# Patient Record
Sex: Male | Born: 1995 | Race: White | Hispanic: Yes | Marital: Single | Smoking: Never smoker
Health system: Southern US, Community
[De-identification: ages and names within clinical notes are randomized; demographics above are authoritative.]

## PROBLEM LIST (undated history)

## (undated) DIAGNOSIS — F32A Depression, unspecified: Secondary | ICD-10-CM

## (undated) DIAGNOSIS — T7840XA Allergy, unspecified, initial encounter: Secondary | ICD-10-CM

## (undated) DIAGNOSIS — F329 Major depressive disorder, single episode, unspecified: Secondary | ICD-10-CM

## (undated) DIAGNOSIS — E669 Obesity, unspecified: Secondary | ICD-10-CM

## (undated) DIAGNOSIS — E119 Type 2 diabetes mellitus without complications: Secondary | ICD-10-CM

## (undated) DIAGNOSIS — J45909 Unspecified asthma, uncomplicated: Secondary | ICD-10-CM

## (undated) HISTORY — PX: CIRCUMCISION: SUR203

---
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
## 2014-02-03 ENCOUNTER — Other Ambulatory Visit (HOSPITAL_COMMUNITY): Payer: Self-pay | Admitting: Pediatric Endocrinology

## 2014-02-06 ENCOUNTER — Other Ambulatory Visit (HOSPITAL_COMMUNITY): Payer: Self-pay | Admitting: Pediatric Endocrinology

## 2014-02-16 ENCOUNTER — Other Ambulatory Visit (HOSPITAL_COMMUNITY): Payer: Self-pay | Admitting: Pediatric Endocrinology

## 2014-02-17 ENCOUNTER — Other Ambulatory Visit: Payer: Self-pay | Admitting: *Deleted

## 2014-02-17 DIAGNOSIS — E1065 Type 1 diabetes mellitus with hyperglycemia: Secondary | ICD-10-CM

## 2014-02-17 DIAGNOSIS — IMO0002 Reserved for concepts with insufficient information to code with codable children: Secondary | ICD-10-CM

## 2014-02-17 LAB — COMPREHENSIVE METABOLIC PANEL
ALT: 11 U/L (ref 0–53)
AST: 13 U/L (ref 0–37)
Albumin: 4.6 g/dL (ref 3.5–5.2)
Alkaline Phosphatase: 104 U/L (ref 39–117)
BUN: 18 mg/dL (ref 6–23)
CALCIUM: 9.5 mg/dL (ref 8.4–10.5)
CHLORIDE: 104 meq/L (ref 96–112)
CO2: 29 meq/L (ref 19–32)
Creat: 0.79 mg/dL (ref 0.50–1.35)
Glucose, Bld: 204 mg/dL (ref 70–99)
POTASSIUM: 4.2 meq/L (ref 3.5–5.3)
SODIUM: 141 meq/L (ref 135–145)
TOTAL PROTEIN: 7.1 g/dL (ref 6.0–8.3)
Total Bilirubin: 0.6 mg/dL (ref 0.2–1.1)

## 2014-02-17 LAB — HEMOGLOBIN A1C
HEMOGLOBIN A1C: 10.4 % — AB (ref <5.7)
Mean Plasma Glucose: 252 mg/dL — ABNORMAL HIGH (ref <117)

## 2014-02-17 LAB — LIPID PANEL
CHOLESTEROL: 220 mg/dL — AB (ref 0–169)
HDL: 43 mg/dL (ref >34)
LDL CALC: 167 mg/dL — AB (ref 0–109)
TRIGLYCERIDES: 51 mg/dL (ref <150)
Total CHOL/HDL Ratio: 5.1 Ratio
VLDL: 10 mg/dL (ref 0–40)

## 2014-02-18 LAB — T4, FREE: FREE T4: 1.2 ng/dL (ref 0.80–1.80)

## 2014-02-18 LAB — MICROALBUMIN / CREATININE URINE RATIO
Creatinine, Urine: 484.3 mg/dL
MICROALB/CREAT RATIO: 3.8 mg/g (ref 0.0–30.0)
Microalb, Ur: 1.82 mg/dL (ref 0.00–1.89)

## 2014-02-18 LAB — C-PEPTIDE: C PEPTIDE: 0.15 ng/mL — AB (ref 0.80–3.90)

## 2014-02-18 LAB — T3, FREE: T3 FREE: 3.7 pg/mL (ref 2.3–4.2)

## 2014-02-18 LAB — TSH: TSH: 1.741 u[IU]/mL (ref 0.350–4.500)

## 2014-02-22 ENCOUNTER — Other Ambulatory Visit: Payer: Self-pay | Admitting: *Deleted

## 2014-02-22 ENCOUNTER — Encounter: Payer: Self-pay | Admitting: "Endocrinology

## 2014-02-22 ENCOUNTER — Ambulatory Visit (INDEPENDENT_AMBULATORY_CARE_PROVIDER_SITE_OTHER): Payer: Medicaid Other | Admitting: "Endocrinology

## 2014-02-22 VITALS — BP 120/61 | HR 76 | Wt 212.0 lb

## 2014-02-22 DIAGNOSIS — E11649 Type 2 diabetes mellitus with hypoglycemia without coma: Secondary | ICD-10-CM

## 2014-02-22 DIAGNOSIS — E049 Nontoxic goiter, unspecified: Secondary | ICD-10-CM

## 2014-02-22 DIAGNOSIS — E1065 Type 1 diabetes mellitus with hyperglycemia: Secondary | ICD-10-CM

## 2014-02-22 DIAGNOSIS — E1169 Type 2 diabetes mellitus with other specified complication: Secondary | ICD-10-CM

## 2014-02-22 DIAGNOSIS — IMO0002 Reserved for concepts with insufficient information to code with codable children: Secondary | ICD-10-CM

## 2014-02-22 DIAGNOSIS — E663 Overweight: Secondary | ICD-10-CM

## 2014-02-22 LAB — GLUCOSE, POCT (MANUAL RESULT ENTRY): POC Glucose: 247 mg/dl — AB (ref 70–99)

## 2014-02-22 MED ORDER — ACCU-CHEK FASTCLIX LANCETS MISC: 1.0000 | Freq: Every day | Status: DC

## 2014-02-22 MED ORDER — INSULIN ASPART 100 UNIT/ML FLEXPEN: PEN_INJECTOR | SUBCUTANEOUS | Status: DC

## 2014-02-22 MED ORDER — GLUCOSE BLOOD VI STRP
ORAL_STRIP | Status: DC
Start: 2014-02-22 — End: 2014-07-30

## 2014-02-22 MED ORDER — INSULIN GLARGINE 100 UNIT/ML SOLOSTAR PEN: PEN_INJECTOR | SUBCUTANEOUS | Status: DC

## 2014-02-22 NOTE — Progress Notes (Signed)
Subjective:  Patient Name: Justin [name] Date of Birth: 13-Mar-1996  MRN: [id number]  Justin [name]  presents to the office today for follow-up evaluation and management of his type 1 diabetes (diagnosed Sept 2013), depression, and noncompliance  HISTORY OF PRESENT ILLNESS:   Justin [name] is a 18 y.o. Hispanic male   Justin [name] was unaccompanied.  1. Justin [name] was diagnosed to have diabetes mellitus by his PCP, Dr. Willow [name], during a routine physical exam on 08/05/12. He complained of urinary frequency, urinary urgency, and increased thirst and drinking of fluids, Dr. Drue [name] obtained a urinalysis which showed both glycosuria and ketonuria. CBG was 433. Dr. Drue [name] then referred the patient to Dr. Romero [name], adult endocrinologist at [facility]. As part of Dr. George [name] initial evaluation, he ordered several lab tests. Serum glucose was 383, CO2 was 21, HbA1c was 10.4%. TSH was 2.84. Dr. Everardo [name] diagnosed T1DM and started the patient on a multiple daily injection (MDI) of insulins, using Lantus as a basal insulin and Novolog aspart as a bolus insulin at mealtimes. Unfortunately, the patient was not compliant with checking BGs or taking insulins. In October 2013 he was admitted to [facility] in DKA with pH 6.99. At that time I assumed management of Justin [name]'s diabetes.    2. The patient's last [facility] visit was on 02/16/13. In the interim, he has been fairly healthy. He takes a Lantus dose of 40 units. He also follows his Novolog 150/50/15 plan. He checks his BGs "when I can, 2-4 times per day."  Overall he feels much happier.   3. Pertinent Review of Systems:  Constitutional: The patient feels "great". The patient seems healthy and active. Eyes: Vision seems to be good. There are no recognized eye problems. Last eye exam was in 2013. He has a follow up appointment soon. Neck: The patient has no complaints of anterior neck swelling, soreness, tenderness, pressure, discomfort, or difficulty swallowing.   Heart: Heart rate increases with  exercise or other physical activity. The patient has no complaints of palpitations, irregular heart beats, chest pain, or chest pressure.   Gastrointestinal: Bowel movents seem normal. The patient has no complaints of excessive hunger, acid reflux, upset stomach, stomach aches or pains, diarrhea, or constipation.  Legs: Muscle mass and strength seem normal. There are no complaints of numbness, tingling, burning, or pain. No edema is noted.  Feet: There are no obvious foot problems. There are no complaints of numbness, tingling, burning, or pain. No edema is noted.  Neurologic: There are no recognized problems with muscle movement and strength, sensation, or coordination.  4. BG printout: He checks BGs 2-4 times per day, mostly 3 times per day. He often misses the bedtime BG check. His average BG is 253, range 137-454. He needs more insulin across the board.   PAST MEDICAL, FAMILY, AND SOCIAL HISTORY  Past Medical History  Diagnosis Date  . Diabetes mellitus without complication   . Asthma     as a child, hospitalized at age 30  . Allergy     allergic rhinitis  . Obesity   . Depression     Family History  Problem Relation Age of Onset  . Cancer Neg Hx   . Diabetes Neg Hx   . Heart failure Neg Hx   . Hyperlipidemia Neg Hx   . Thyroid disease Neg Hx   . Stroke Neg Hx     Current outpatient prescriptions:ACCU-CHEK FASTCLIX LANCETS MISC, 1 each by Does not apply route 6 (six) times daily. Check sugar 6 x  daily, Disp: 204 each, Rfl: 3;  ACCU-CHEK SMARTVIEW test strip, CHECK SUGAR 6 X DAILY, Disp: 200 each, Rfl: 3;  insulin aspart (NOVOLOG FLEXPEN) 100 UNIT/ML injection, Inject 1-50 Units into the skin daily., Disp: 5 pen, Rfl: 3 insulin glargine (LANTUS SOLOSTAR) 100 UNIT/ML injection, Up to 50 units per day as directed by MD, Disp: 15 mL, Rfl: 3;  Insulin Pen Needle 31G X 5 MM MISC, Use with insulin pen 6 x daily, Disp: 200 each, Rfl: 6;  LANTUS SOLOSTAR 100 UNIT/ML SOPN, UP TO 50 UNITS PER  DAY AS DIRECTED BY MD, Disp: 15 pen, Rfl: 3;  NOVOLOG FLEXPEN 100 UNIT/ML SOPN FlexPen, INJECT 1-50 UNITS INTO THE SKIN DAILY., Disp: 5 pen, Rfl: 3  Allergies as of 02/22/2014  . (No Known Allergies)     reports that he has never smoked. He has never used smokeless tobacco. He reports that he does not drink alcohol or use illicit drugs. Pediatric History  Patient Guardian Status  . Father:  Justin [name]   Other Topics Concern  . 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 The patient does not yet drive. Father is the owner of a painting and roofing company. Mother is a stay-at-home mom.   Work and family: He graduated from high school last Summer. 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 He still lives at home.  Activities: He works out occasionally. Primary Care Provider: Willow Ora, MD  REVIEW OF SYSTEMS: There are no other significant problems involving Justin [name]'s other body systems.   Objective:  Vital Signs:  BP 120/61  Pulse 76  Wt 212 lb (96.163 kg)   Ht Readings from Last 3 Encounters:  02/16/13 5' 6.93" (1.7 m) (22%*, Z = -0.77)  11/13/12 5' 7.01" (1.702 m) (24%*, Z = -0.70)  11/04/12 5\' 7"  (1.702 m) (24%*, Z = -0.70)   * Growth percentiles are based on CDC 2-20 Years data.   Wt Readings from Last 3 Encounters:  02/22/14 212 lb (96.163 kg) (96%*, Z = 1.80)  02/16/13 191 lb (86.637 kg) (93%*, Z = 1.48)  11/13/12 185 lb 14.4 oz (84.324 kg) (92%*, Z = 1.41)   * Growth percentiles are based on CDC 2-20 Years data.   HC Readings from Last 3 Encounters:  No data found for [facility]   There is no height on file to calculate BSA. No height on file for this encounter. 96%ile (Z=1.80) based on CDC 2-20 Years weight-for-age data.    PHYSICAL EXAM:  Constitutional: The patient appears healthy and well nourished. The patient's height and weight are normal for age. He has  gained 27 pounds in the past year, a lot of which is muscle. He is alert and bright. Head: The head is normocephalic. Face: The face appears normal. There are no obvious dysmorphic features. Eyes: The eyes appear to be normally formed and spaced. Gaze is conjugate. There is no obvious arcus or proptosis. Moisture appears normal. Ears: The ears are normally placed and appear externally normal. Mouth: The oropharynx and tongue appear normal. Dentition appears to be normal for age. Oral moisture is normal. Neck: The neck appears to be visibly enlarged. His strap muscles are enlarged. The thyroid gland is enlarged at 23-25 grams in size. The consistency of the thyroid gland is normal. The thyroid gland is not tender to palpation.  Lungs: The lungs are clear to  auscultation. Air movement is good. Heart: Heart rate and rhythm are regular. Heart sounds S1 and S2 are normal. I did not appreciate any pathologic cardiac murmurs. Abdomen: The abdomen appears to be normal in size for the patient's age. Bowel sounds are normal. There is no obvious hepatomegaly, splenomegaly, or other mass effect.  Arms: Muscle size and bulk are normal for age. Hands: There is no obvious tremor. Phalangeal and metacarpophalangeal joints are normal. Palmar muscles are normal for age. Palmar skin is normal. Palmar moisture is also normal. Legs: Muscles appear normal for age. No edema is present. Feet: Feet are normally formed. Dorsalis pedal pulses are normal.  Neurologic: Strength is normal for age in both the upper and lower extremities. Muscle tone is normal. Sensation to touch is normal in both the legs and feet.    LAB DATA:   Results for orders placed in visit on 02/22/14 (from the past 504 hour(s))  GLUCOSE, POCT (MANUAL RESULT ENTRY)   Collection Time    02/22/14  1:12 PM      Result Value Ref Range   POC Glucose 247 (*) 70 - 99 mg/dl  Results for orders placed in visit on 02/17/14 (from the past 504 hour(s))   COMPREHENSIVE METABOLIC PANEL   Collection Time    02/17/14  3:42 PM      Result Value Ref Range   Sodium 141  135 - 145 mEq/L   Potassium 4.2  3.5 - 5.3 mEq/L   Chloride 104  96 - 112 mEq/L   CO2 29  19 - 32 mEq/L   Glucose, Bld 204 (*) 70 - 99 mg/dL   BUN 18  6 - 23 mg/dL   Creat 1.610.79  0.960.50 - 0.451.35 mg/dL   Total Bilirubin 0.6  0.2 - 1.1 mg/dL   Alkaline Phosphatase 104  39 - 117 U/L   AST 13  0 - 37 U/L   ALT 11  0 - 53 U/L   Total Protein 7.1  6.0 - 8.3 g/dL   Albumin 4.6  3.5 - 5.2 g/dL   Calcium 9.5  8.4 - 40.910.5 mg/dL  LIPID PANEL   Collection Time    02/17/14  3:42 PM      Result Value Ref Range   Cholesterol 220 (*) 0 - 169 mg/dL   Triglycerides 51  <811<150 mg/dL   HDL 43  >91>34 mg/dL   Total CHOL/HDL Ratio 5.1     VLDL 10  0 - 40 mg/dL   LDL Cholesterol 478167 (*) 0 - 109 mg/dL  MICROALBUMIN / CREATININE URINE RATIO   Collection Time    02/17/14  3:42 PM      Result Value Ref Range   Microalb, Ur 1.82  0.00 - 1.89 mg/dL   Creatinine, Urine 295.6484.3     Microalb Creat Ratio 3.8  0.0 - 30.0 mg/g  TSH   Collection Time    02/17/14  3:42 PM      Result Value Ref Range   TSH 1.741  0.350 - 4.500 uIU/mL  T4, FREE   Collection Time    02/17/14  3:42 PM      Result Value Ref Range   Free T4 1.20  0.80 - 1.80 ng/dL  T3, FREE   Collection Time    02/17/14  3:42 PM      Result Value Ref Range   T3, Free 3.7  2.3 - 4.2 pg/mL  C-PEPTIDE   Collection Time    02/17/14  3:42 PM      Result Value Ref Range   C-Peptide 0.15 (*) 0.80 - 3.90 ng/mL  HEMOGLOBIN A1C   Collection Time    02/17/14  3:42 PM      Result Value Ref Range   Hemoglobin A1C 10.4 (*) <5.7 %   Mean Plasma Glucose 252 (*) <117 mg/dL   Labs 1/61/09: UEA5W 09.8 %, compared with 7.2% in March 2014.  CMP normal, except glucose 204; cholesterol 220, triglycerides 51, HDL 43, LDL 167; urinary microalbumin/creatinine ratio 3.8; TSH 1.741, free T4 1.20, free T3 3.7; C-peptide < 0.15   Assessment and Plan:    ASSESSMENT: 1. Type 1 diabetes: His BG control is much worse. He produces very little, if any, insulin on his own.  2. Hypoglycemia: None recently   3. Overweight: By BMI he is overweight, but in fact he is very muscular.  4. Non-compliance/ Depression: He is doing much better as he matures emotionally. He is not depressed today. 5. Goiter: His thyroid gland is larger, but he remains euthyroid. He likely has evolving Hashimoto's thyroiditis.   PLAN:  1. Diagnostic: A1C today. Continue home monitoring. Work on minimum 4 checks per day. Call on a Wednesday or Sunday evening in one month to discuss BGs.  2. Therapeutic: Increase Lantus by one unit per week until most AM BGs are in the 80-120 range or you reach a max dose of up to 45 units per day. Increase Novolog by 1 unit at [name] meals.  3. Patient education: Discussed changes to behavior and health since starting to take better care of his sugar.   4. Follow-up: 3 months   Level of Service: This visit lasted in excess of 40 minutes. More than 50% of the visit was devoted to counseling.  David Stall, MD

## 2014-02-22 NOTE — Patient Instructions (Addendum)
Follow up visit in 3 months. Please increase your Lantus dose by one unit per week until most AM BGs are in the 80-120 range or you reach a maximum dose of 45 units per day. Please add one additional unit of Novolog at each meal. Call in one month on a Wednesday or Sunday evening between 8:00-9:30 PM.

## 2014-04-20 ENCOUNTER — Ambulatory Visit: Payer: Self-pay | Admitting: Pediatric Endocrinology

## 2014-05-24 ENCOUNTER — Other Ambulatory Visit: Payer: Self-pay | Admitting: Pediatric Endocrinology

## 2014-05-24 ENCOUNTER — Other Ambulatory Visit (HOSPITAL_COMMUNITY): Payer: Self-pay | Admitting: Internal Medicine

## 2014-05-25 ENCOUNTER — Other Ambulatory Visit: Payer: Self-pay | Admitting: Pediatric Endocrinology

## 2014-05-25 DIAGNOSIS — E1065 Type 1 diabetes mellitus with hyperglycemia: Secondary | ICD-10-CM

## 2014-05-25 DIAGNOSIS — IMO0002 Reserved for concepts with insufficient information to code with codable children: Secondary | ICD-10-CM

## 2014-05-26 ENCOUNTER — Encounter: Payer: Self-pay | Admitting: Pediatric Endocrinology

## 2014-05-26 ENCOUNTER — Ambulatory Visit (INDEPENDENT_AMBULATORY_CARE_PROVIDER_SITE_OTHER): Payer: Medicaid Other | Admitting: Pediatric Endocrinology

## 2014-05-26 VITALS — BP 117/71 | HR 89 | Wt 215.4 lb

## 2014-05-26 DIAGNOSIS — IMO0002 Reserved for concepts with insufficient information to code with codable children: Secondary | ICD-10-CM

## 2014-05-26 DIAGNOSIS — R739 Hyperglycemia, unspecified: Secondary | ICD-10-CM

## 2014-05-26 DIAGNOSIS — R7309 Other abnormal glucose: Secondary | ICD-10-CM

## 2014-05-26 DIAGNOSIS — E1065 Type 1 diabetes mellitus with hyperglycemia: Secondary | ICD-10-CM

## 2014-05-26 LAB — GLUCOSE, POCT (MANUAL RESULT ENTRY): POC Glucose: 248 mg/dl — AB (ref 70–99)

## 2014-05-26 LAB — POCT GLYCOSYLATED HEMOGLOBIN (HGB A1C): Hemoglobin A1C: 10.4

## 2014-05-26 NOTE — Patient Instructions (Signed)
Work on checking sugar more often and paying more attention to your carb count. Use GOMEALS app to help  Rules of 150: Total carbs for day <150 grams Total exercise for week >150 minutes Target blood sugar 150  Goal- 4 checks per day and cover your carbs.

## 2014-05-26 NOTE — Progress Notes (Signed)
Subjective:  Patient Name: Justin [name] Date of Birth: May 28, 1996  MRN: [id number]  Justin [name]  presents to the office today for follow-up evaluation and management of his type 1 diabetes (diagnosed Sept 2013), depression, and noncompliance  HISTORY OF PRESENT ILLNESS:   Justin [name] is a 18 y.o. Hispanic male   Justin [name] was unaccompanied.  1. Justin [name] was diagnosed to have diabetes mellitus by his PCP, Dr. Willow Ora, during a routine physical exam on 08/05/12. He complained of urinary frequency, urinary urgency, and increased thirst and drinking of fluids, Dr. Drue Novel obtained a urinalysis which showed both glycosuria and ketonuria. CBG was 433. Dr. Drue Novel then referred the patient to Dr. Romero Belling, adult endocrinologist at [facility]. As part of Dr. George Hugh initial evaluation, he ordered several lab tests. Serum glucose was 383, CO2 was 21, HbA1c was 10.4%. TSH was 2.84. Dr. Everardo All diagnosed T1DM and started the patient on a multiple daily injection (MDI) of insulins, using Lantus as a basal insulin and Novolog aspart as a bolus insulin at mealtimes. Unfortunately, the patient was not compliant with checking BGs or taking insulins. In October 2013 he was admitted to [facility] in DKA with pH 6.99. At that time we assumed management of Justin [name]'s diabetes.    2. The patient's last [facility] visit was on 02/22/14. In the interim, he has been fairly healthy. He had gone 1 year without being seen between 3/14 and 3/15.   He takes a Lantus dose of 45 units. He also follows his Novolog 150/50/15 +1 plan. He tends to eat a bedtime snack that he does not cover. He has been trying to break out of that habit. He usually does not count his carbs but takes 10-15 units depending on size of meal and how high he thinks his sugar. He thinks that when he works outside in the sun his sugars tend to be lower. He has not had any real lows. He admits he doesn't usually pay that much attention to his diabetes, misses bgs, and doesn't count carbs. 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 He is somewhat nervous about going to see a new doctor.   3. Pertinent Review of Systems:  Constitutional: The patient feels "great". The patient seems healthy and active. Eyes: Vision seems to be good. There are no recognized eye problems. Last eye exam was April 2015. He got new glasses.  Neck: The patient has no complaints of anterior neck swelling, soreness, tenderness, pressure, discomfort, or difficulty swallowing.   Heart: Heart rate increases with exercise or other physical activity. The patient has no complaints of palpitations, irregular heart beats, chest pain, or chest pressure.   Gastrointestinal: Bowel movents seem normal. The patient has no complaints of excessive hunger, acid reflux, upset stomach, stomach aches or pains, diarrhea, or constipation.  Legs: Muscle mass and strength seem normal. There are no complaints of numbness, tingling, burning, or pain. No edema is noted.  Feet: There are no obvious foot problems. There are no complaints of numbness, tingling, burning, or pain. No edema is noted.  Neurologic: There are no recognized problems with muscle movement and strength, sensation, or coordination.  Diabetes ID: none  4. BG printout: He is checking 3.3 times per day avg (usually 2, sometimes more). Avg BG 270 +/- 86. Range 110-583   PAST MEDICAL, FAMILY, AND SOCIAL HISTORY  Past Medical History  Diagnosis Date  . Diabetes mellitus without complication   . Asthma     as  a child, hospitalized at age 89  . Allergy     allergic rhinitis  . Obesity   . Depression     Family History  Problem Relation Age of Onset  . Cancer Neg Hx   . Diabetes Neg Hx   . Heart failure Neg Hx   . Hyperlipidemia Neg Hx   . Thyroid disease Neg Hx   . Stroke Neg Hx     Current outpatient prescriptions:ACCU-CHEK FASTCLIX LANCETS MISC, 1 each by Does not apply route 6 (six) times daily. Check sugar 6 x daily, Disp: 204  each, Rfl: 4;  B-D UF III MINI PEN NEEDLES 31G X 5 MM MISC, USE WITH INSULIN PEN 6 X DAILY, Disp: 200 each, Rfl: 6;  glucose blood (ACCU-CHEK SMARTVIEW) test strip, Check blood sugar 6x day., Disp: 200 each, Rfl: 4 insulin aspart (NOVOLOG FLEXPEN) 100 UNIT/ML FlexPen, INJECT 1-50 UNITS INTO THE SKIN DAILY., Disp: 5 pen, Rfl: 4;  Insulin Glargine (LANTUS SOLOSTAR) 100 UNIT/ML Solostar Pen, UP TO 50 UNITS PER DAY AS DIRECTED BY MD, Disp: 15 pen, Rfl: 4  Allergies as of 05/26/2014  . (No Known Allergies)     reports that he has never smoked. He has never used smokeless tobacco. He reports that he does not drink alcohol or use illicit drugs. Pediatric History  Patient Guardian Status  . Mother:  Nied,Wendy  . Father:  Mitzel,Eduardo   Other Topics Concern  . Not on file   Social History Narrative   The patient is a Holiday representativesenior in high school. 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 The patient does not yet drive. Father is the owner of a painting and roofing company. Mother is a stay-at-home mom.   Work and family: He graduated from high school last Summer. 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 He still lives at home.  Activities: He works out occasionally. Primary Care Provider: Willow OraJose Paz, MD  REVIEW OF SYSTEMS: There are no other significant problems involving Justin [name]'s other body systems.   Objective:  Vital Signs:  BP 117/71  Pulse 89  Wt 215 lb 6.4 oz (97.705 kg)   Ht Readings from Last 3 Encounters:  02/16/13 5' 6.93" (1.7 m) (22%*, Z = -0.77)  11/13/12 5' 7.01" (1.702 m) (24%*, Z = -0.70)  11/04/12 5\' 7"  (1.702 m) (24%*, Z = -0.70)   * Growth percentiles are based on CDC 2-20 Years data.   Wt Readings from Last 3 Encounters:  05/26/14 215 lb 6.4 oz (97.705 kg) (97%*, Z = 1.85)  02/22/14 212 lb (96.163 kg) (96%*, Z = 1.80)  02/16/13 191 lb (86.637 kg) (93%*, Z = 1.48)   * Growth percentiles are based on CDC 2-20 Years data.   HC Readings from Last  3 Encounters:  [facility] for [facility]   There is no height on file to calculate BSA. No height on file for this encounter. 97%ile (Z=1.85) based on CDC 2-20 Years weight-for-age data.    PHYSICAL EXAM:  Constitutional: The patient appears healthy and well nourished. The patient's height and weight are normal for age.  Head: The head is normocephalic. Face: The face appears normal. There are no obvious dysmorphic features. Eyes: The eyes appear to be normally formed and spaced. Gaze is conjugate. There is no obvious arcus or proptosis. Moisture appears normal. Ears: The ears are normally placed and appear externally normal. Mouth: The oropharynx and tongue appear normal. Dentition appears to be normal for age.  Oral moisture is normal. Neck: The neck appears to be visibly enlarged. His strap muscles are enlarged. The thyroid gland is enlarged at 23-25 grams in size. The consistency of the thyroid gland is normal. The thyroid gland is not tender to palpation.  Lungs: The lungs are clear to auscultation. Air movement is good. Heart: Heart rate and rhythm are regular. Heart sounds S1 and S2 are normal. I did not appreciate any pathologic cardiac murmurs. Abdomen: The abdomen appears to be normal in size for the patient's age. Bowel sounds are normal. There is no obvious hepatomegaly, splenomegaly, or other mass effect. Mild lipohypertophy Arms: Muscle size and bulk are normal for age. Hands: There is no obvious tremor. Phalangeal and metacarpophalangeal joints are normal. Palmar muscles are normal for age. Palmar skin is normal. Palmar moisture is also normal. Legs: Muscles appear normal for age. No edema is present. Feet: Feet are normally formed. Dorsalis pedal pulses are normal.  Neurologic: Strength is normal for age in both the upper and lower extremities. Muscle tone is normal. Sensation to touch is normal in both the legs and feet.    LAB DATA:   Results for orders placed in visit on  05/26/14 (from the past 504 hour(s))  GLUCOSE, POCT (MANUAL RESULT ENTRY)   Collection Time    05/26/14  1:19 PM      Result Value Ref Range   POC Glucose 248 (*) 70 - 99 mg/dl  POCT GLYCOSYLATED HEMOGLOBIN (HGB A1C)   Collection Time    05/26/14  1:23 PM      Result Value Ref Range   Hemoglobin A1C 10.4     Labs 02/17/14: HbA1c 10.4 %, compared with 7.2% in March 2014.  CMP normal, except glucose 204; cholesterol 220, triglycerides 51, HDL 43, LDL 167; urinary microalbumin/creatinine ratio 3.8; TSH 1.741, free T4 1.20, free T3 3.7; C-peptide < 0.15   Assessment and Plan:   ASSESSMENT: 1. Type 1 diabetes: A1C stable since last visit with continued weight gain. Could be much better controlled if he put effort into his care.   2. Hypoglycemia: None recently   3. Overweight: By BMI he is overweight.  4. Non-compliance/ Depression: He is able to verbalize where he struggles and where he could do better. He is not depressed today.  PLAN:  1. Diagnostic: A1C today. Continue home monitoring. Work on minimum 4 checks per day. Call or email with sugars to discuss BGs.  2. Therapeutic: continue current doses. Need to work on carb counting.  3. Patient education: Reviewed Dentistmeter download and discussed days where his care was better and days where he struggled more. He admitted to not really counting carbs and we were able to install carb counting app on his phone. Discussed that changing his carb ratio wouldn't really change his doses if he is not counting carbs. Also discussed lowering his carb intake to help with weight management (rules of 150).  4. Follow-up: 3 months   Level of Service: This visit lasted in excess of 25 minutes. More than 50% of the visit was devoted to counseling.  Cammie SickleBADIK, JENNIFER REBECCA, MD

## 2014-07-30 ENCOUNTER — Other Ambulatory Visit: Payer: Self-pay | Admitting: "Endocrinology

## 2014-08-31 ENCOUNTER — Encounter: Payer: Self-pay | Admitting: Pediatric Endocrinology

## 2014-08-31 ENCOUNTER — Ambulatory Visit (INDEPENDENT_AMBULATORY_CARE_PROVIDER_SITE_OTHER): Payer: Medicaid Other | Admitting: Pediatric Endocrinology

## 2014-08-31 VITALS — BP 125/71 | HR 78 | Wt 213.1 lb

## 2014-08-31 DIAGNOSIS — IMO0002 Reserved for concepts with insufficient information to code with codable children: Secondary | ICD-10-CM

## 2014-08-31 DIAGNOSIS — E109 Type 1 diabetes mellitus without complications: Secondary | ICD-10-CM

## 2014-08-31 DIAGNOSIS — E1065 Type 1 diabetes mellitus with hyperglycemia: Secondary | ICD-10-CM

## 2014-08-31 DIAGNOSIS — Z23 Encounter for immunization: Secondary | ICD-10-CM

## 2014-08-31 LAB — POCT GLYCOSYLATED HEMOGLOBIN (HGB A1C): Hemoglobin A1C: 9.1

## 2014-08-31 LAB — GLUCOSE, POCT (MANUAL RESULT ENTRY): POC GLUCOSE: 301 mg/dL — AB (ref 70–99)

## 2014-08-31 NOTE — Progress Notes (Signed)
Subjective:  Patient Name: Justin [name] Date of Birth: 1996/11/19  MRN: [id number]  Justin [name]  presents to the office today for follow-up evaluation and management of his type 1 diabetes (diagnosed Sept 2013), depression, and noncompliance  HISTORY OF PRESENT ILLNESS:   Justin [name] is a 18 y.o. Hispanic male   Justin [name] was unaccompanied.  1. Jefry was diagnosed to have diabetes mellitus by his PCP, Dr. Willow Ora, during a routine physical exam on 08/05/12. He complained of urinary frequency, urinary urgency, and increased thirst and drinking of fluids, Dr. Drue Novel obtained a urinalysis which showed both glycosuria and ketonuria. CBG was 433. Dr. Drue Novel then referred the patient to Dr. Romero Belling, adult endocrinologist at [facility]. As part of Dr. George Hugh initial evaluation, he ordered several lab tests. Serum glucose was 383, CO2 was 21, HbA1c was 10.4%. TSH was 2.84. Dr. Everardo All diagnosed T1DM and started the patient on a multiple daily injection (MDI) of insulins, using Lantus as a basal insulin and Novolog aspart as a bolus insulin at mealtimes. Unfortunately, the patient was not compliant with checking BGs or taking insulins. In October 2013 he was admitted to [facility] in DKA with pH 6.99. At that time we assumed management of Terrall's diabetes.    2. The patient's last [facility] visit was on 05/26/14. In the interim, he has been fairly healthy.  He feels that his care is the same or somewhat better than last visit. He has changed how he is eating and has reduced his carbs and increased his protein. He forgot his Lantus last night but thinks he usually remembers   He takes a Lantus dose of 45 units. He also follows his Novolog 150/50/15 +1 plan. He feels he is doing better with counting carbs. He is planning to start [employer] to study criminal justice. He has given up plans to move to [place] in October to study Primary school teacher.   3. Pertinent Review of Systems:  Constitutional: The patient feels "good". The patient seems  healthy and active. Excited to have dinner with his GF and her GM tonight.  Eyes: Vision seems to be good. There are no recognized eye problems. Last eye exam was April 2015. He got new glasses.  Neck: The patient has no complaints of anterior neck swelling, soreness, tenderness, pressure, discomfort, or difficulty swallowing.   Heart: Heart rate increases with exercise or other physical activity. The patient has no complaints of palpitations, irregular heart beats, chest pain, or chest pressure.   Gastrointestinal: Bowel movents seem normal. The patient has no complaints of excessive hunger, acid reflux, upset stomach, stomach aches or pains, diarrhea, or constipation.  Legs: Muscle mass and strength seem normal. There are no complaints of numbness, tingling, burning, or pain. No edema is noted.  Feet: There are no obvious foot problems. There are no complaints of numbness, tingling, burning, or pain. No edema is noted.  Neurologic: There are no recognized problems with muscle movement and strength, sensation, or coordination.  Diabetes ID: none  4. BG printout:  He is checking 3.5 times per day avg. Avg BG 243 +/- 80 Range 96-470  Last visit: He is checking 3.3 times per day avg (usually 2, sometimes more). Avg BG 270 +/- 86. Range 110-583   PAST MEDICAL, FAMILY, AND SOCIAL HISTORY  Past Medical History  Diagnosis Date  . Diabetes mellitus without complication   . Asthma     as a child, hospitalized at age 73  . Allergy     allergic rhinitis  .  Obesity   . Depression     Family History  Problem Relation Age of Onset  . Cancer Neg Hx   . Diabetes Neg Hx   . Heart failure Neg Hx   . Hyperlipidemia Neg Hx   . Thyroid disease Neg Hx   . Stroke Neg Hx     Current outpatient prescriptions:ACCU-CHEK FASTCLIX LANCETS MISC, 1 each by Does not apply route 6 (six) times daily. Check sugar 6 x daily, Disp: 204 each, Rfl: 4;  ACCU-CHEK SMARTVIEW test strip, CHECK BLOOD SUGAR 6 TIMES A DAY,  Disp: 200 each, Rfl: 6;  B-D UF III MINI PEN NEEDLES 31G X 5 MM MISC, USE WITH INSULIN PEN 6 X DAILY, Disp: 200 each, Rfl: 6 Insulin Glargine (LANTUS SOLOSTAR) 100 UNIT/ML Solostar Pen, UP TO 50 UNITS PER DAY AS DIRECTED BY MD, Disp: 15 pen, Rfl: 4;  NOVOLOG FLEXPEN 100 UNIT/ML FlexPen, INJECT 1-50 UNITS INTO THE SKIN DAILY., Disp: 5 pen, Rfl: 6  Allergies as of 08/31/2014  . (No Known Allergies)     reports that he has never smoked. He has never used smokeless tobacco. He reports that he does not drink alcohol or use illicit drugs. Pediatric History  Patient Guardian Status  . Mother:  Douds,Wendy  . Father:  Lantier,Eduardo   Other Topics Concern  . Not on file   Social History Narrative   The patient is a Holiday representativesenior in high school. 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 The patient does not yet drive. Father is the owner of a painting and roofing company. Mother is a stay-at-home mom.   Work and family: He graduated from high school last Summer. 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 He still lives at home.  Activities: He works out occasionally. Primary Care Provider: Willow OraJose Paz, MD  REVIEW OF SYSTEMS: There are no other significant problems involving Gad's other body systems.   Objective:  Vital Signs:  BP 125/71  Pulse 78  Wt 213 lb 1.6 oz (96.662 kg)   Ht Readings from Last 3 Encounters:  02/16/13 5' 6.93" (1.7 m) (22%*, Z = -0.77)  11/13/12 5' 7.01" (1.702 m) (24%*, Z = -0.70)  11/04/12 5\' 7"  (1.702 m) (24%*, Z = -0.70)   * Growth percentiles are based on CDC 2-20 Years data.   Wt Readings from Last 3 Encounters:  08/31/14 213 lb 1.6 oz (96.662 kg) (96%*, Z = 1.78)  05/26/14 215 lb 6.4 oz (97.705 kg) (97%*, Z = 1.85)  02/22/14 212 lb (96.163 kg) (96%*, Z = 1.80)   * Growth percentiles are based on CDC 2-20 Years data.   HC Readings from Last 3 Encounters:  [facility]   There is no height on file to calculate BSA. No height on  file for this encounter. 96%ile (Z=1.78) based on CDC 2-20 Years weight-for-age data.    PHYSICAL EXAM:  Constitutional: The patient appears healthy and well nourished. The patient's height and weight are normal for age.  Head: The head is normocephalic. Face: The face appears normal. There are no obvious dysmorphic features. Eyes: The eyes appear to be normally formed and spaced. Gaze is conjugate. There is no obvious arcus or proptosis. Moisture appears normal. Ears: The ears are normally placed and appear externally normal. Mouth: The oropharynx and tongue appear normal. Dentition appears to be normal for age. Oral moisture is normal. Neck: The neck appears to be visibly enlarged. His strap muscles are enlarged.  The thyroid gland is enlarged at 23-25 grams in size. The consistency of the thyroid gland is normal. The thyroid gland is not tender to palpation.  Lungs: The lungs are clear to auscultation. Air movement is good. Heart: Heart rate and rhythm are regular. Heart sounds S1 and S2 are normal. I did not appreciate any pathologic cardiac murmurs. Abdomen: The abdomen appears to be normal in size for the patient's age. Bowel sounds are normal. There is no obvious hepatomegaly, splenomegaly, or other mass effect. Mild lipohypertophy Arms: Muscle size and bulk are normal for age. Hands: There is no obvious tremor. Phalangeal and metacarpophalangeal joints are normal. Palmar muscles are normal for age. Palmar skin is normal. Palmar moisture is also normal. Legs: Muscles appear normal for age. No edema is present. Feet: Feet are normally formed. Dorsalis pedal pulses are normal.  Neurologic: Strength is normal for age in both the upper and lower extremities. Muscle tone is normal. Sensation to touch is normal in both the legs and feet.    LAB DATA:   Results for orders placed in visit on 08/31/14 (from the past 504 hour(s))  GLUCOSE, POCT (MANUAL RESULT ENTRY)   Collection Time     08/31/14  1:43 PM      Result Value Ref Range   POC Glucose 301 (*) 70 - 99 mg/dl  POCT GLYCOSYLATED HEMOGLOBIN (HGB A1C)   Collection Time    08/31/14  1:52 PM      Result Value Ref Range   Hemoglobin A1C 9.1     Labs 02/17/14: HbA1c 10.4 %, compared with 7.2% in March 2014.  CMP normal, except glucose 204; cholesterol 220, triglycerides 51, HDL 43, LDL 167; urinary microalbumin/creatinine ratio 3.8; TSH 1.741, free T4 1.20, free T3 3.7; C-peptide < 0.15   Assessment and Plan:   ASSESSMENT:  1. Type 1 diabetes: A1C has continued to improve but is still not back to where he was 2 years ago. Is clearly putting more effort into his care  2. Hypoglycemia: None recently   3. Overweight: By BMI he is overweight.  4. Non-compliance/ Depression: He is able to verbalize where he struggles and where he could do better. He is not depressed today.  PLAN:  1. Diagnostic:  A1C today. Continue home monitoring. Work on minimum 4 checks per day. Call or email with sugars to discuss BGs.  2. Therapeutic: continue current doses. Need to work on carb counting. Will refer back to nutrition.  3. Patient education: Reviewed meter download and discussed changes to insulin doses. Agreed to give more Lantus as overall stable only higher than he wants to be. Discussed carb counting- which he feels is better than at last visit. Discussed moderate/low carb diets such as [employer].  Discussed flu shot today (recommended for all T1DM patients).  4. Follow-up: 3 months   Level of Service: This visit lasted in excess of 25 minutes. More than 50% of the visit was devoted to counseling.  Cammie Sickle, MD

## 2014-08-31 NOTE — Patient Instructions (Addendum)
Increase Lantus to 46 units tonight. Increase to 47 units in 3 nights if sugars still above 200 when you wake up. If you are taking 48 units and you feel that you need more- please call me.   Look for [employer] Diet information  Nutrition should call you to schedule

## 2014-09-06 ENCOUNTER — Ambulatory Visit: Payer: Medicaid Other | Admitting: *Deleted

## 2014-10-06 ENCOUNTER — Ambulatory Visit: Payer: Medicaid Other | Admitting: *Deleted

## 2014-11-05 ENCOUNTER — Emergency Department (HOSPITAL_COMMUNITY)
Admission: EM | Admit: 2014-11-05 | Discharge: 2014-11-05 | Disposition: A | Discharge status: Home / Self Care | Payer: Medicaid Other | Attending: Emergency Medicine | Admitting: Emergency Medicine

## 2014-11-05 ENCOUNTER — Encounter (HOSPITAL_COMMUNITY): Payer: Self-pay | Admitting: Emergency Medicine

## 2014-11-05 DIAGNOSIS — E119 Type 2 diabetes mellitus without complications: Secondary | ICD-10-CM | POA: Insufficient documentation

## 2014-11-05 DIAGNOSIS — E669 Obesity, unspecified: Secondary | ICD-10-CM | POA: Insufficient documentation

## 2014-11-05 DIAGNOSIS — J45909 Unspecified asthma, uncomplicated: Secondary | ICD-10-CM | POA: Insufficient documentation

## 2014-11-05 DIAGNOSIS — Z794 Long term (current) use of insulin: Secondary | ICD-10-CM | POA: Insufficient documentation

## 2014-11-05 DIAGNOSIS — Z8659 Personal history of other mental and behavioral disorders: Secondary | ICD-10-CM | POA: Insufficient documentation

## 2014-11-05 DIAGNOSIS — Z760 Encounter for issue of repeat prescription: Secondary | ICD-10-CM

## 2014-11-05 LAB — CBG MONITORING, ED: Glucose-Capillary: 239 mg/dL — ABNORMAL HIGH (ref 70–99)

## 2014-11-05 MED ORDER — INSULIN ASPART 100 UNIT/ML ~~LOC~~ SOLN: SUBCUTANEOUS | Status: DC

## 2014-11-05 MED ORDER — "INSULIN SYRINGE 29G X 1/2"" 1 ML MISC": Status: DC

## 2014-11-05 NOTE — ED Notes (Signed)
Case manager at bedside helping PT get prescriptions.

## 2014-11-05 NOTE — Progress Notes (Signed)
ED CM in to meet with patient regarding medication assistance, and establishing PCP. Patient presented to the ED with hyperglycemia after running out of insulin. Patient is uninsured and covered under Medicaid. Discussed [facility] and establishing primary care, patient agreeable. [facility] and medication assistance with insulin. [facility], arrangements made for patient to get insulin today,and appt schedules for 12/11 at 11:15  Patient also instructed on how to draw up insulin patient demonstrates understanding. Reviewed information with patient on insulin and f/u appt.  Patient verbalizes understanding teach back done. Patient being discharge with prescription for insulin, [facility]. Patient states he is on his way. No further ED CM needs identified.

## 2014-11-05 NOTE — ED Notes (Signed)
Per patient, unable to get insulin because medicaid does not cover him due to his age. States he went to PCP and they were not there, unable to obtain insulin. Pt states it will take up to 45 days to be approved for insurance, but needs insulin before then. Has been out since yesterday. Pt has no other complaints.

## 2014-11-05 NOTE — ED Notes (Addendum)
CBG:239 

## 2014-11-05 NOTE — Discharge Instructions (Signed)
Report to [facility] now to obtain your insulin.

## 2014-11-05 NOTE — ED Provider Notes (Signed)
CSN: [id number]     Arrival date & time 11/05/14  1546 History  This chart was scribed for non-physician practitioner, Felicie Mornavid Anadia Helmes, NP working with Gerhard Munchobert Lockwood, MD by Gwenyth Oberatherine Macek, ED scribe. This patient was seen in room TR09C/TR09C and the patient's care was started at 4:29 PM   Chief Complaint  Patient presents with  . Medication Refill   The history is provided by the patient. No language interpreter was used.   HPI Comments: Justin [name] is a 18 y.o. male with a history of DM who presents to the Emergency Department in need of a medication refill for insulin. He ran out of his insulin yesterday.  He states that he normally takes Novolog on a sliding scale. Pt is still a dependent on his parents and notes he was covered until his 18th birthday. He states that he was unable to purchase insulin because Medicaid does not cover him at 18 and that it could take up to 45 days for insurance to take effect. He went to his PCP today, but was unable to see him. Pt's blood sugar is 239 in the ED and states his blood sugar fluctuates based on diet. He denies any other complaints at this time.    Past Medical History  Diagnosis Date  . Diabetes mellitus without complication   . Asthma     as a child, hospitalized at age 89  . Allergy     allergic rhinitis  . Obesity   . Depression    Past Surgical History  Procedure Laterality Date  . Circumcision     Family History  Problem Relation Age of Onset  . Cancer Neg Hx   . Diabetes Neg Hx   . Heart failure Neg Hx   . Hyperlipidemia Neg Hx   . Thyroid disease Neg Hx   . Stroke Neg Hx    History  Substance Use Topics  . Smoking status: Never Smoker   . Smokeless tobacco: Never Used  . Alcohol Use: No    Review of Systems  Constitutional: Negative for fever.  Neurological: Negative for headaches.  All other systems reviewed and are negative.     Allergies  Review of patient's allergies indicates no known allergies.  Home  Medications   Prior to Admission medications   Medication Sig Start Date End Date Taking? Authorizing Provider  ACCU-CHEK FASTCLIX LANCETS MISC 1 each by Does not apply route 6 (six) times daily. Check sugar 6 x daily 02/22/14   Marilin Kofman StallMichael J Brennan, MD  ACCU-CHEK SMARTVIEW test strip CHECK BLOOD SUGAR 6 TIMES A DAY 08/02/14   Dessa PhiJennifer Badik, MD  B-D UF III MINI PEN NEEDLES 31G X 5 MM MISC USE WITH INSULIN PEN 6 X DAILY    Dessa PhiJennifer Badik, MD  Insulin Glargine (LANTUS SOLOSTAR) 100 UNIT/ML Solostar Pen UP TO 50 UNITS PER DAY AS DIRECTED BY MD 02/22/14   Tatym Schermer StallMichael J Brennan, MD  NOVOLOG FLEXPEN 100 UNIT/ML FlexPen INJECT 1-50 UNITS INTO THE SKIN DAILY. 08/02/14   Dessa PhiJennifer Badik, MD   BP 124/75 mmHg  Pulse 86  Temp(Src) 98.6 F (37 C) (Oral)  Resp 18  SpO2 100% Physical Exam  Constitutional: He is oriented to person, place, and time. He appears well-developed and well-nourished.  HENT:  Head: Normocephalic.  Eyes: Pupils are equal, round, and reactive to light.  Neck: Neck supple.  Cardiovascular: Normal rate and regular rhythm.   Pulmonary/Chest: Effort normal and breath sounds normal.  Musculoskeletal: He exhibits no edema or  tenderness.  Neurological: He is alert and oriented to person, place, and time.  Skin: Skin is warm and dry.  Psychiatric: He has a normal mood and affect.  Nursing note and vitals reviewed.   ED Course  Procedures (including critical care time) DIAGNOSTIC STUDIES: Oxygen Saturation is 100% on RA, normal by my interpretation.    COORDINATION OF CARE: 4:42 PM Discussed treatment plan with pt which includes follow-up with case manager. Pt agreed to plan.    Labs Review Labs Reviewed  CBG MONITORING, ED - Abnormal; Notable for the following:    Glucose-Capillary 239 (*)    All other components within normal limits    Imaging Review No results found.   EKG Interpretation None     Discussed patient with case manager.  Patient will be able to obtain his  insulin from the health and wellness center for now.  Case manager provided patient with insulin injection instruction and education, as he is used to using a flex-pen.   MDM   Final diagnoses:  None  Medication refill.   I personally performed the services described in this documentation, which was scribed in my presence. The recorded information has been reviewed and is accurate.    Jimmye Normanavid John Sofija Antwi, NP 11/05/14 1736  Gerhard Munchobert Lockwood, MD 11/05/14 [phone]

## 2014-11-12 ENCOUNTER — Ambulatory Visit: Payer: Medicaid Other | Admitting: Internal Medicine

## 2014-12-04 ENCOUNTER — Emergency Department (HOSPITAL_COMMUNITY)
Admission: EM | Admit: 2014-12-04 | Discharge: 2014-12-04 | Disposition: A | Discharge status: Home / Self Care | Payer: Medicaid Other | Attending: Emergency Medicine | Admitting: Emergency Medicine

## 2014-12-04 ENCOUNTER — Encounter (HOSPITAL_COMMUNITY): Payer: Self-pay | Admitting: Physical Medicine and Rehabilitation

## 2014-12-04 DIAGNOSIS — J45909 Unspecified asthma, uncomplicated: Secondary | ICD-10-CM | POA: Insufficient documentation

## 2014-12-04 DIAGNOSIS — E109 Type 1 diabetes mellitus without complications: Secondary | ICD-10-CM | POA: Insufficient documentation

## 2014-12-04 DIAGNOSIS — IMO0002 Reserved for concepts with insufficient information to code with codable children: Secondary | ICD-10-CM

## 2014-12-04 DIAGNOSIS — Z760 Encounter for issue of repeat prescription: Secondary | ICD-10-CM | POA: Insufficient documentation

## 2014-12-04 DIAGNOSIS — Z794 Long term (current) use of insulin: Secondary | ICD-10-CM | POA: Insufficient documentation

## 2014-12-04 DIAGNOSIS — E1065 Type 1 diabetes mellitus with hyperglycemia: Secondary | ICD-10-CM

## 2014-12-04 DIAGNOSIS — Z8659 Personal history of other mental and behavioral disorders: Secondary | ICD-10-CM | POA: Insufficient documentation

## 2014-12-04 DIAGNOSIS — E669 Obesity, unspecified: Secondary | ICD-10-CM | POA: Insufficient documentation

## 2014-12-04 LAB — CBG MONITORING, ED: GLUCOSE-CAPILLARY: 300 mg/dL — AB (ref 70–99)

## 2014-12-04 MED ORDER — INSULIN ASPART 100 UNIT/ML ~~LOC~~ SOLN
4.0000 [IU] | Freq: Once | SUBCUTANEOUS | Status: AC
  Administered 2014-12-04: 4 [IU] via SUBCUTANEOUS
  Filled 2014-12-04: qty 1

## 2014-12-04 MED ORDER — INSULIN ASPART 100 UNIT/ML FLEXPEN: PEN_INJECTOR | SUBCUTANEOUS | Status: DC

## 2014-12-04 MED ORDER — INSULIN GLARGINE 100 UNIT/ML SOLOSTAR PEN: PEN_INJECTOR | SUBCUTANEOUS | Status: DC

## 2014-12-04 NOTE — ED Provider Notes (Signed)
CSN: [id number]     Arrival date & time 12/04/14  0820 History   First MD Initiated Contact with Patient 12/04/14 0827     Chief Complaint  Patient presents with  . Hyperglycemia     (Consider location/radiation/quality/duration/timing/severity/associated sxs/prior Treatment) HPI    PCP: Justin Ora, MD  Justin [name] is a 19 y.o.male with a significant PMH of diabetes, asthma, allergy, obesity, depression presents to the ER  for medication refill. He takes Lantus and Novolog. His sugar was 400 at home. He has not had any medications since last night. He prefers the [facility] and still has his pen needles He otherwise is feeling well. He says he will go [facility] to [facility] for his medication refill like he did at his previous visit last month. Denies polyuria, fevers, rash, increased thirst, body aches, abdominal pains, nausea, vomiting or diarrhea.  Blood pressure 139/78, pulse 108, temperature 98.7 F (37.1 C), temperature source Oral, resp. rate 18, height  (1.702 m), weight 213 lb (96.616 kg), SpO2 98 %.  Past Medical History  Diagnosis Date  . Diabetes mellitus without complication   . Asthma     as a child, hospitalized at age 67  . Allergy     allergic rhinitis  . Obesity   . Depression    Past Surgical History  Procedure Laterality Date  . Circumcision     Family History  Problem Relation Age of Onset  . Cancer Neg Hx   . Diabetes Neg Hx   . Heart failure Neg Hx   . Hyperlipidemia Neg Hx   . Thyroid disease Neg Hx   . Stroke Neg Hx    History  Substance Use Topics  . Smoking status: Never Smoker   . Smokeless tobacco: Never Used  . Alcohol Use: No    Review of Systems  10 Systems reviewed and are negative for acute change except as noted in the HPI.   Allergies  Review of patient's allergies indicates no known allergies.  Home Medications   Prior to Admission medications   Medication Sig Start Date End Date Taking? Authorizing Provider   insulin aspart (NOVOLOG) 100 UNIT/ML injection Take per your sliding scale instructions Patient taking differently: Inject 6-15 Units into the skin 3 (three) times daily with meals. Take per your sliding scale instructions 11/05/14  Yes Jimmye Norman, NP  insulin aspart (NOVOLOG FLEXPEN) 100 UNIT/ML FlexPen INJECT 1-50 UNITS INTO THE SKIN DAILY. 12/04/14   Dorthula Matas, PA-C  Insulin Glargine (LANTUS SOLOSTAR) 100 UNIT/ML Solostar Pen UP TO 50 UNITS PER DAY AS DIRECTED BY MD 12/04/14   Dorthula Matas, PA-C  INSULIN SYRINGE 1CC/29G 29G X 1/2" 1 ML MISC Use for sliding scale coverage as directed. 11/05/14   Jimmye Norman, NP   BP 139/78 mmHg  Pulse 108  Temp(Src) 98.7 F (37.1 C) (Oral)  Resp 18  Ht  (1.702 m)  Wt 213 lb (96.616 kg)  BMI 33.35 kg/m2  SpO2 98% Physical Exam  Constitutional: He appears well-developed and well-nourished. No distress.  HENT:  Head: Normocephalic and atraumatic.  Eyes: Pupils are equal, round, and reactive to light.  Neck: Normal range of motion. Neck supple.  Cardiovascular: Normal rate and regular rhythm.   Pulmonary/Chest: Effort normal.  Abdominal: Soft.  Neurological: He is alert.  Skin: Skin is warm and dry.  Nursing note and vitals reviewed.   ED Course  Procedures (including critical care time) Labs Review Labs  Reviewed  CBG MONITORING, ED - Abnormal; Notable for the following:    Glucose-Capillary 300 (*)    All other components within normal limits    Imaging Review No results found.   EKG Interpretation None      MDM   Final diagnoses:  Type 1 diabetes mellitus, uncontrolled  Medication refill    Medications  insulin aspart (novoLOG) injection 4 Units (not administered)   Pt also fed in the ED.   Medications refilled in the ED.  insulin aspart (NOVOLOG FLEXPEN) 100 UNIT/ML FlexPen INJECT 1-50 UNITS INTO THE SKIN DAILY. 5 pen [name], PA-C   Insulin Glargine (LANTUS SOLOSTAR) 100 UNIT/ML Solostar  Pen UP TO 50 UNITS PER DAY AS DIRECTED BY MD 15 pen [name], PA-C   CBG is 300.  18 y.o.Wash Dona's evaluation in the Emergency Department is complete. It has been determined that no acute conditions requiring further emergency intervention are present at this time. The patient/guardian have been advised of the diagnosis and plan. We have discussed signs and symptoms that warrant return to the ED, such as changes or worsening in symptoms.  Vital signs are stable at discharge. Filed Vitals:   12/04/14 0832  BP: 139/78  Pulse: 108  Temp: 98.7 F (37.1 C)  Resp: 18    Patient/guardian has voiced understanding and agreed to follow-up with the PCP or specialist.    Dorthula Matas, PA-C 12/04/14 6440  [name], MD 12/04/14 1357

## 2014-12-04 NOTE — ED Notes (Signed)
Pt presents to department for evaluation of hyperglycemia. States he ran out of Lantus and Novolog. Pt also admits to recent marijuana use. Pt is alert and oriented x4. NAD.

## 2014-12-04 NOTE — Progress Notes (Addendum)
  CARE MANAGEMENT ED NOTE 12/05/2014  Patient:  Justin [name], Justin [name]   Account Number:  [id number]  Date Initiated:  12/04/2014  Documentation initiated by:  [facility]  Subjective/Objective Assessment:     Subjective/Objective Assessment Detail:   Patient presented to [facility] ED stating he ran out of insulin. Patient is a type one diabetic who recently lost medicaid due to aging out.     Action/Plan:   Medication assistance.  Referral to PCP   Action/Plan Detail:   MATCH [place]  2nd Referral to [facility]   Anticipated DC Date:  12/04/2014     Status Recommendation to Physician:   Result of Recommendation:  Agreed  Other ED Services  Consult Working Plan    DC Planning Services  CM consult  MATCH Program  PCP issues    Choice offered to / List presented to:            Status of service:  Completed, signed off  ED Comments:   ED Comments Detail:  ED CM received message from T. Neva Seat PA-C concerning patient needing medication assistance.  Contacted patient, he states he cannot afford his prescription spoke with patient last month regarding establishing care at the [facility]. Walked patient over to clinic to get assistance with insulin. Patient was instructed that f/u for medication assistance. Follow-up was reiterated and the importance to patient. Patient verbalized understanding. Discussed MATCH program and the guidelines with patient, he is eligible. Patient enrolled in [place] program. Letter was printed and faxed to [facility] at [phone] received fax confirmation. Reiterated that patient has to walk in Monday 1/4 to the [facility] to establsh care with a PCP. Patient instructed to take [facility] to [facility], [facility] letter was already faxed. Patient verbalized understanding teach back done.  No further ED CM needs identified.

## 2014-12-04 NOTE — ED Notes (Signed)
Pt states he recently ran out of Lantus and Novolog, states blood sugar in 400's at home. Pt is alert and oriented x4. Denies pain.

## 2014-12-04 NOTE — ED Notes (Signed)
CBG 300. 

## 2014-12-06 ENCOUNTER — Ambulatory Visit: Payer: Medicaid Other | Admitting: Internal Medicine

## 2014-12-09 ENCOUNTER — Ambulatory Visit: Payer: Medicaid Other | Admitting: Pediatric Endocrinology

## 2014-12-09 NOTE — Progress Notes (Signed)
ED CM contacted patient to [facility] 12/16/14 at 11:15am with Holland CommonsValerie Keck NP. Patient verbalized understanding and appreciation. CM encouraged patient to make scheduled appt, and to call at least 24 hours in advance if he is unable to keep this appt. Verbalized understanding teach back done. No further ED CM needs identified.

## 2014-12-16 ENCOUNTER — Ambulatory Visit: Payer: Medicaid Other | Attending: Internal Medicine | Admitting: Internal Medicine

## 2014-12-16 ENCOUNTER — Encounter: Payer: Self-pay | Admitting: Internal Medicine

## 2014-12-16 VITALS — BP 106/66 | HR 67 | Temp 98.7°F | Resp 16 | Ht 67.0 in | Wt 221.0 lb

## 2014-12-16 DIAGNOSIS — E109 Type 1 diabetes mellitus without complications: Secondary | ICD-10-CM

## 2014-12-16 DIAGNOSIS — E1065 Type 1 diabetes mellitus with hyperglycemia: Secondary | ICD-10-CM | POA: Insufficient documentation

## 2014-12-16 DIAGNOSIS — Z794 Long term (current) use of insulin: Secondary | ICD-10-CM | POA: Insufficient documentation

## 2014-12-16 LAB — POCT CBG (FASTING - GLUCOSE)-MANUAL ENTRY: GLUCOSE FASTING, POC: 209 mg/dL — AB (ref 70–99)

## 2014-12-16 LAB — POCT GLYCOSYLATED HEMOGLOBIN (HGB A1C): HEMOGLOBIN A1C: 11.3

## 2014-12-16 MED ORDER — INSULIN ASPART 100 UNIT/ML ~~LOC~~ SOLN: SUBCUTANEOUS | Status: DC

## 2014-12-16 MED ORDER — INSULIN ASPART 100 UNIT/ML FLEXPEN: PEN_INJECTOR | SUBCUTANEOUS | Status: DC

## 2014-12-16 MED ORDER — INSULIN GLARGINE 100 UNIT/ML SOLOSTAR PEN: PEN_INJECTOR | SUBCUTANEOUS | Status: DC

## 2014-12-16 NOTE — Progress Notes (Signed)
Pt is here to establish care. Pt has a history of diabetes type 1. Pt has no C.C. Today.

## 2014-12-16 NOTE — Patient Instructions (Signed)
Type 1 Diabetes Mellitus Type 1 diabetes mellitus, often simply referred to as diabetes, is a long-term (chronic) disease. It occurs when the islet cells in the pancreas that make insulin (a hormone) are destroyed and can no longer make insulin. Insulin is needed to move sugars from food into the tissue cells. The tissue cells use the sugars for energy. In people with type 1 diabetes, the sugars build up in the blood instead of going into the tissue cells. As a result, high blood sugar (hyperglycemia) develops. Without insulin, the body breaks down fat cells for the needed energy. This breakdown of fat cells produces acid chemicals (ketones), which increases the acid levels in the body. The effect of either high ketone or high sugar (glucose) levels can be life-threatening.  Type 1 diabetes was also previously called juvenile diabetes. It most often occurs before the age of 30, but it can occur at any age. RISK FACTORS A person is predisposed to developing type 1 diabetes if someone in his or her family has the disease and is exposed to certain additional environmental triggers.  SYMPTOMS  Symptoms of type 1 diabetes may develop gradually over days to weeks or suddenly. The symptoms occur due to hyperglycemia. The symptoms can include:   Increased thirst (polydipsia).  Increased urination (polyuria).  Increased urination during the night (nocturia).  Weight loss. This weight loss may be rapid.  Frequent, recurring infections.  Tiredness (fatigue).  Weakness.  Vision changes, such as blurred vision.  Fruity smell to your breath.  Abdominal pain.  Nausea or vomiting. DIAGNOSIS  Type 1 diabetes is diagnosed when symptoms of diabetes are present and when blood glucose levels are increased. Your blood glucose level may be checked by one or more of the following blood tests:  A fasting blood glucose test. You will not be allowed to eat for at least 8 hours before a blood sample is  taken.  A random blood glucose test. Your blood glucose is checked at any time of the day regardless of when you ate.  A hemoglobin A1c blood glucose test. A hemoglobin A1c test provides information about blood glucose control over the previous 3 months. TREATMENT  Although type 1 diabetes cannot be prevented, it can be managed with insulin, diet, and exercise.  You will need to take insulin daily to keep blood glucose in the desired range.  You will need to match insulin dosing with exercise and healthy food choices. The treatment goal is to maintain the before-meal blood sugar (preprandial glucose) level at 70-130 mg/dL.  HOME CARE INSTRUCTIONS   Have your hemoglobin A1c level checked twice a year.  Perform daily blood glucose monitoring as directed by your health care provider.  Monitor urine ketones when you are ill and as directed by your health care provider.  Take your insulin as directed by your health care provider to maintain your blood glucose level in the desired range.  Never run out of insulin. It is needed every day.  Adjust insulin based on your intake of carbohydrates. Carbohydrates can raise blood glucose levels but need to be included in your diet. Carbohydrates provide vitamins, minerals, and fiber, which are an essential part of a healthy diet. Carbohydrates are found in fruits, vegetables, whole grains, dairy products, legumes, and foods containing added sugars.  Eat healthy foods. Alternate 3 meals with 3 snacks.  Maintain a healthy weight.  Carry a medical alert card or wear your medical alert jewelry.  Carry a 15-gram carbohydrate snack   with you at all times to treat low blood glucose (hypoglycemia). Some examples of 15-gram carbohydrate snacks include:  Glucose tablets, 3 or 4.  Glucose gel, 15-gram tube.  Raisins, 2 tablespoons (24 grams).  Jelly beans, 6.  Animal crackers, 8.  Fruit juice, regular soda, or low-fat milk, 4 ounces (120  mL).  Gummy treats, 9.  Recognize hypoglycemia. Hypoglycemia occurs with blood glucose levels of 70 mg/dL and below. The risk for hypoglycemia increases when fasting or skipping meals, during or after intense exercise, and during sleep. Hypoglycemia symptoms can include:  Tremors or shakes.  Decreased ability to concentrate.  Sweating.  Increased heart rate.  Headache.  Dry mouth.  Hunger.  Irritability.  Anxiety.  Restless sleep.  Altered speech or coordination.  Confusion.  Treat hypoglycemia promptly. If you are alert and able to safely swallow, follow the 15:15 rule:  Take 15-20 grams of rapid-acting glucose or carbohydrate. Rapid-acting options include glucose gel, glucose tablets, or 4 ounces (120 mL) of fruit juice, regular soda, or low-fat milk.  Check your blood glucose level 15 minutes after taking the glucose.  Take 15-20 grams more of glucose if the repeat blood glucose level is still 70 mg/dL or below.  Eat a meal or snack within 1 hour once blood glucose levels return to normal.  Be alert to polyuria and polydipsia, which are early signs of hyperglycemia. An early awareness of hyperglycemia allows for prompt treatment. Treat hyperglycemia as directed by your health care provider.  Exercise regularly as directed by your health care provider. This includes:  Performing resistance training twice a week such as push-ups, sit-ups, lifting weights, or using resistance bands.  Performing 150 minutes of cardio exercises each week such as walking, running, or playing sports.  Staying active and spending no more than 90 minutes at one time being inactive.  Adjust your insulin dosing and food intake as needed if you start a new exercise or sport.  Follow your sick-day plan at any time you are unable to eat or drink as usual.   Do not use any tobacco products including cigarettes, chewing tobacco, or electronic cigarettes. If you need help quitting, ask your  health care provider.  Limit alcohol intake to no more than 1 drink per day for nonpregnant women and 2 drinks per day for men. You should drink alcohol only when you are also eating food. Talk with your health care provider about whether alcohol is safe for you. Tell your health care provider if you drink alcohol several times a week.  Keep all follow-up visits as directed by your health care provider.  Schedule an eye exam within 5 years of diagnosis and then annually.  Perform daily skin and foot care. Examine your skin and feet daily for cuts, bruises, redness, nail problems, bleeding, blisters, or sores. A foot exam by a health care provider should be done annually.  Brush your teeth and gums at least twice a day and floss at least once a day. Follow up with your dentist regularly.  Share your diabetes management plan with your workplace or school.  Stay up-to-date with immunizations. It is recommended that people with diabetes who are over 65 years old get the pneumonia vaccine. In some cases, two separate shots may be given. Ask your health care provider if your pneumonia vaccination is up-to-date.  Learn to manage stress.  Obtain ongoing diabetes education and support as needed.  Participate in or seek rehabilitation as needed to maintain or improve independence   and quality of life. Request a physical or occupational therapy referral if you are having foot or hand numbness, or difficulties with grooming, dressing, eating, or physical activity. SEEK MEDICAL CARE IF:   You are unable to eat food or drink fluids for more than 6 hours.  You have nausea and vomiting for more than 6 hours.  Your blood glucose level is over 240 mg/dL.  There is a change in mental status.  You develop an additional serious illness.  You have diarrhea for more than 6 hours.  You have been sick or have had a fever for a couple of days and are not getting better.  You have pain during any physical  activity. SEEK IMMEDIATE MEDICAL CARE IF:  You have difficulty breathing.  You have moderate to large ketone levels. MAKE SURE YOU:  Understand these instructions.  Will watch your condition.  Will get help right away if you are not doing well or get worse. Document Released: 11/16/2000 Document Revised: 04/05/2014 Document Reviewed: 06/17/2012 Exit[employer] Patient Information 2015 [employer]. This information is not intended to replace advice given to you by your health care provider. Make sure you discuss any questions you have with your health care provider.  

## 2014-12-16 NOTE — Progress Notes (Addendum)
Patient ID: Justin [name], male   DOB: 08/22/1996, 19 y.o.   MRN: [id number]  [id number]  [id number]  DOB - 12-05-1995  CC:  Chief Complaint  Patient presents with  . Establish Care       HPI: Justin [name] is a 19 y.o. male here today to establish medical care.  Patient has a past medical history of T1DM, asthma, obesity, and depression.  He reports that he was seeing a endocrinologist in the past but recently lost his medicaid so he is now unable to go back.  He currently is taking Lantus 47 units QHS and Novolog is carb counting and sliding scale.  He checks his blood sugar 3-4 times per day with fasting blood sugars of 200-513.  He reports that he was out of insulin for 2 weeks around the holiday and that is why he had high blood sugars.  His last eye exam was last year.  He is familiar with a diabetic diet and states that he does strive to follow the diet.  Patient has No headache, No chest pain, No abdominal pain - No Nausea, No new weakness tingling or numbness, No Cough - SOB.  No Known Allergies Past Medical History  Diagnosis Date  . Diabetes mellitus without complication   . Asthma     as a child, hospitalized at age 89  . Allergy     allergic rhinitis  . Obesity   . Depression    Current Outpatient Prescriptions on File Prior to Visit  Medication Sig Dispense Refill  . insulin aspart (NOVOLOG FLEXPEN) 100 UNIT/ML FlexPen INJECT 1-50 UNITS INTO THE SKIN DAILY. 5 pen 6  . Insulin Glargine (LANTUS SOLOSTAR) 100 UNIT/ML Solostar Pen UP TO 50 UNITS PER DAY AS DIRECTED BY MD 15 pen 4  . INSULIN SYRINGE 1CC/29G 29G X 1/2" 1 ML MISC Use for sliding scale coverage as directed. 100 each 2  . insulin aspart (NOVOLOG) 100 UNIT/ML injection Take per your sliding scale instructions (Patient not taking: Reported on 12/16/2014) 10 mL 2   No current facility-administered medications on file prior to visit.   Family History  Problem Relation Age of Onset  . Cancer Neg Hx   .  Diabetes Neg Hx   . Heart failure Neg Hx   . Hyperlipidemia Neg Hx   . Thyroid disease Neg Hx   . Stroke Neg Hx    History   Social History  . Marital Status: Single    Spouse Name: N/A    Number of Children: N/A  . Years of Education: N/A   Occupational History  . Not on file.   Social History Main Topics  . Smoking status: Never Smoker   . Smokeless tobacco: Never Used  . Alcohol Use: No  . Drug Use: Yes    Special: Marijuana  . Sexual Activity:    Partners: Female    Pharmacist, hospital Protection: Condom   Other Topics Concern  . 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 The patient does not yet drive. Father is the owner of a painting and roofing company. Mother is a stay-at-home mom.    Review of Systems: Constitutional: Negative for fever, chills, diaphoresis, activity change, appetite change and fatigue. HENT: Negative for ear pain, nosebleeds, congestion, facial swelling, rhinorrhea, neck pain, neck stiffness and ear discharge.  Eyes: Negative for pain, discharge, redness, itching and visual disturbance.  Respiratory: Negative for cough, choking, chest tightness, shortness of breath, wheezing and stridor.  Cardiovascular: Negative for chest pain, palpitations and leg swelling. Gastrointestinal: Negative for abdominal distention. Genitourinary: Negative for dysuria, urgency, frequency, hematuria, flank pain, decreased urine volume, difficulty urinating and dyspareunia.  Musculoskeletal: Negative for back pain, joint swelling, arthralgia and gait problem. Neurological: Negative for dizziness, tremors, seizures, syncope, facial asymmetry, speech difficulty, weakness, light-headedness, numbness and headaches.  Hematological: Negative for adenopathy. Does not bruise/bleed easily. Psychiatric/Behavioral: Negative for hallucinations, behavioral problems, confusion, dysphoric mood, decreased  concentration and agitation.    Objective:   Filed Vitals:   12/16/14 1122  BP: 106/66  Pulse: 67  Temp: 98.7 F (37.1 C)  Resp: 16    Physical Exam: Constitutional: Patient appears well-developed and well-nourished. No distress. HENT: Normocephalic, atraumatic, External right and left ear normal. Oropharynx is clear and moist.  Eyes: Conjunctivae and EOM are normal. PERRLA, no scleral icterus. Neck: Normal ROM. Neck supple. No JVD. No tracheal deviation. No thyromegaly. CVS: RRR, S1/S2 +, no murmurs, no gallops, no carotid bruit.  Pulmonary: Effort and breath sounds normal, no stridor, rhonchi, wheezes, rales.  Abdominal: Soft. BS +, no distension, tenderness, rebound or guarding.  Musculoskeletal: Normal range of motion. No edema and no tenderness.  Lymphadenopathy: No lymphadenopathy noted, cervical Neuro: Alert. Normal reflexes, muscle tone coordination. No cranial nerve deficit. Skin: Skin is warm and dry. No rash noted. Not diaphoretic. No erythema. No pallor. Psychiatric: Normal mood and affect. Behavior, judgment, thought content normal. DIABETIC FOOT EXAM: Examination of the feet reveals normal posterior tibial and dorsalis pedis pulses. Skin to palpation and inspection is intact, without lesions or corns. No discoloration is present. Monofilament nylon test reveals normal sensation bilaterally over plantar surfaces of distal great toe, first, third, and fifth metatarsal heads. Brisk capillary refill    Lab Results  Component Value Date   WBC 7.7 11/04/2012   HGB 14.1 11/04/2012   HCT 42.2 11/04/2012   MCV 82.4 11/04/2012   PLT 264 11/04/2012   Lab Results  Component Value Date   CREATININE 0.79 02/17/2014   BUN 18 02/17/2014   NA 141 02/17/2014   K 4.2 02/17/2014   CL 104 02/17/2014   CO2 29 02/17/2014    Lab Results  Component Value Date   HGBA1C 11.30 12/16/2014   Lipid Panel     Component Value Date/Time   CHOL 220* 02/17/2014 1542   TRIG 51  02/17/2014 1542   HDL 43 02/17/2014 1542   CHOLHDL 5.1 02/17/2014 1542   VLDL 10 02/17/2014 1542   LDLCALC 167* 02/17/2014 1542       Assessment and plan:   [name] was seen today for establish care.  Diagnoses and associated orders for this visit:  Type 1 diabetes mellitus without complication - Glucose (CBG), Fasting - HgB A1c - insulin aspart (NOVOLOG FLEXPEN) 100 UNIT/ML FlexPen; INJECT 1-50 UNITS INTO THE SKIN DAILY. - insulin aspart (NOVOLOG) 100 UNIT/ML injection; Take per your sliding scale instructions - Insulin Glargine (LANTUS SOLOSTAR) 100 UNIT/ML Solostar Pen; Increased to 49 units per night Patients diabetes is uncontrolled as evidence by hemoglobin a1c>11. Stressed the multiple complications associated with uncontrolled diabetes.  Patient will stay on current medication dose and report back to clinic with cbg log in 2 weeks.  I will place referral [facility]    Return in about 2 weeks (around 12/30/2014) for Nurse Visit and 2 mo PCP.     KECK,  Vikki Ports, NP-C [facility] [phone] 12/16/2014, 11:56 AM

## 2014-12-31 ENCOUNTER — Ambulatory Visit: Payer: Self-pay | Attending: Internal Medicine

## 2015-02-24 ENCOUNTER — Other Ambulatory Visit: Payer: Self-pay | Admitting: Internal Medicine

## 2015-02-24 DIAGNOSIS — E109 Type 1 diabetes mellitus without complications: Secondary | ICD-10-CM

## 2015-02-24 MED ORDER — FREESTYLE SYSTEM KIT: 1.0000 | PACK | Status: DC | PRN

## 2015-02-24 MED ORDER — FREESTYLE LANCETS MISC: Status: DC

## 2015-02-24 MED ORDER — GLUCOSE BLOOD VI STRP: ORAL_STRIP | Status: DC

## 2015-03-07 ENCOUNTER — Ambulatory Visit: Payer: No Typology Code available for payment source | Attending: Internal Medicine

## 2015-03-24 ENCOUNTER — Other Ambulatory Visit: Payer: Self-pay | Admitting: Internal Medicine

## 2015-03-24 DIAGNOSIS — E109 Type 1 diabetes mellitus without complications: Secondary | ICD-10-CM

## 2015-03-24 MED ORDER — INSULIN ASPART 100 UNIT/ML FLEXPEN
PEN_INJECTOR | SUBCUTANEOUS | Status: DC
Start: 2015-03-24 — End: 2017-01-21

## 2015-03-24 MED ORDER — INSULIN GLARGINE 100 UNIT/ML SOLOSTAR PEN: PEN_INJECTOR | SUBCUTANEOUS | Status: DC

## 2015-10-05 ENCOUNTER — Emergency Department (HOSPITAL_COMMUNITY)
Admission: EM | Admit: 2015-10-05 | Discharge: 2015-10-06 | Disposition: A | Discharge status: Home / Self Care | Payer: Medicaid Other | Attending: Emergency Medicine | Admitting: Emergency Medicine

## 2015-10-05 ENCOUNTER — Emergency Department (HOSPITAL_COMMUNITY): Payer: Medicaid Other

## 2015-10-05 ENCOUNTER — Encounter (HOSPITAL_COMMUNITY): Payer: Self-pay | Admitting: *Deleted

## 2015-10-05 DIAGNOSIS — E119 Type 2 diabetes mellitus without complications: Secondary | ICD-10-CM | POA: Insufficient documentation

## 2015-10-05 DIAGNOSIS — Y9389 Activity, other specified: Secondary | ICD-10-CM | POA: Diagnosis not present

## 2015-10-05 DIAGNOSIS — Y92410 Unspecified street and highway as the place of occurrence of the external cause: Secondary | ICD-10-CM | POA: Diagnosis not present

## 2015-10-05 DIAGNOSIS — E669 Obesity, unspecified: Secondary | ICD-10-CM | POA: Diagnosis not present

## 2015-10-05 DIAGNOSIS — Z8659 Personal history of other mental and behavioral disorders: Secondary | ICD-10-CM | POA: Diagnosis not present

## 2015-10-05 DIAGNOSIS — Z794 Long term (current) use of insulin: Secondary | ICD-10-CM | POA: Diagnosis not present

## 2015-10-05 DIAGNOSIS — Y998 Other external cause status: Secondary | ICD-10-CM | POA: Insufficient documentation

## 2015-10-05 DIAGNOSIS — S0990XA Unspecified injury of head, initial encounter: Secondary | ICD-10-CM | POA: Diagnosis present

## 2015-10-05 DIAGNOSIS — J45909 Unspecified asthma, uncomplicated: Secondary | ICD-10-CM | POA: Diagnosis not present

## 2015-10-05 DIAGNOSIS — Z79899 Other long term (current) drug therapy: Secondary | ICD-10-CM | POA: Insufficient documentation

## 2015-10-05 MED ORDER — IBUPROFEN 400 MG PO TABS
800.0000 mg | ORAL_TABLET | Freq: Once | ORAL | Status: AC
  Administered 2015-10-05: 800 mg via ORAL
  Filled 2015-10-05: qty 2

## 2015-10-05 MED ORDER — IBUPROFEN 800 MG PO TABS: 800.0000 mg | ORAL_TABLET | Freq: Three times a day (TID) | ORAL | Status: DC
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
## 2015-10-11 ENCOUNTER — Other Ambulatory Visit: Payer: Self-pay

## 2015-10-11 DIAGNOSIS — E109 Type 1 diabetes mellitus without complications: Secondary | ICD-10-CM

## 2015-10-11 MED ORDER — INSULIN GLARGINE 100 UNIT/ML SOLOSTAR PEN: PEN_INJECTOR | SUBCUTANEOUS | Status: DC

## 2015-10-11 NOTE — Telephone Encounter (Signed)
Pharmacy contacted nurse requesting new prescription for medication. Nurse advised pharmacy patient needs appointment.  Nurse sent prescription to pharmacy.

## 2015-11-24 ENCOUNTER — Ambulatory Visit: Payer: Medicaid Other | Attending: Internal Medicine | Admitting: Internal Medicine

## 2015-11-24 ENCOUNTER — Encounter: Payer: Self-pay | Admitting: Internal Medicine

## 2015-11-24 VITALS — BP 122/78 | HR 91 | Temp 98.0°F | Resp 16 | Ht 67.0 in | Wt 218.8 lb

## 2015-11-24 DIAGNOSIS — E1065 Type 1 diabetes mellitus with hyperglycemia: Secondary | ICD-10-CM | POA: Insufficient documentation

## 2015-11-24 DIAGNOSIS — Z794 Long term (current) use of insulin: Secondary | ICD-10-CM | POA: Diagnosis not present

## 2015-11-24 DIAGNOSIS — E109 Type 1 diabetes mellitus without complications: Secondary | ICD-10-CM

## 2015-11-24 DIAGNOSIS — IMO0001 Reserved for inherently not codable concepts without codable children: Secondary | ICD-10-CM

## 2015-11-24 DIAGNOSIS — R739 Hyperglycemia, unspecified: Secondary | ICD-10-CM | POA: Diagnosis not present

## 2015-11-24 DIAGNOSIS — Z9114 Patient's other noncompliance with medication regimen: Secondary | ICD-10-CM | POA: Insufficient documentation

## 2015-11-24 DIAGNOSIS — Z79899 Other long term (current) drug therapy: Secondary | ICD-10-CM | POA: Diagnosis not present

## 2015-11-24 LAB — POCT GLYCOSYLATED HEMOGLOBIN (HGB A1C): Hemoglobin A1C: 12.1

## 2015-11-24 LAB — POCT URINALYSIS DIPSTICK
BILIRUBIN UA: NEGATIVE
Glucose, UA: NEGATIVE
Leukocytes, UA: NEGATIVE
Nitrite, UA: NEGATIVE
PH UA: 6
Protein, UA: NEGATIVE
RBC UA: NEGATIVE
Spec Grav, UA: 1.015
UROBILINOGEN UA: 0.2

## 2015-11-24 LAB — BASIC METABOLIC PANEL
BUN: 18 mg/dL (ref 7–25)
CALCIUM: 9.5 mg/dL (ref 8.6–10.3)
CHLORIDE: 99 mmol/L (ref 98–110)
CO2: 26 mmol/L (ref 20–31)
CREATININE: 0.74 mg/dL (ref 0.60–1.35)
Glucose, Bld: 317 mg/dL — ABNORMAL HIGH (ref 65–99)
Potassium: 4.1 mmol/L (ref 3.5–5.3)
Sodium: 137 mmol/L (ref 135–146)

## 2015-11-24 LAB — GLUCOSE, POCT (MANUAL RESULT ENTRY)
POC GLUCOSE: 334 mg/dL — AB (ref 70–99)
POC Glucose: 294 mg/dl — AB (ref 70–99)

## 2015-11-24 LAB — LIPID PANEL
Cholesterol: 233 mg/dL — ABNORMAL HIGH (ref 125–170)
HDL: 43 mg/dL (ref >=40)
LDL Cholesterol: 141 mg/dL — ABNORMAL HIGH (ref <110)
TRIGLYCERIDES: 244 mg/dL — AB (ref <150)
Total CHOL/HDL Ratio: 5.4 Ratio — ABNORMAL HIGH (ref <=5.0)
VLDL: 49 mg/dL — AB (ref <30)

## 2015-11-24 MED ORDER — INSULIN ASPART 100 UNIT/ML ~~LOC~~ SOLN
10.0000 [IU] | Freq: Once | SUBCUTANEOUS | Status: AC
  Administered 2015-11-24: 10 [IU] via SUBCUTANEOUS

## 2015-11-24 MED ORDER — GLUCOSE BLOOD VI STRP
ORAL_STRIP | Status: DC
Start: 2015-11-24 — End: 2018-04-25

## 2015-11-24 MED ORDER — INSULIN ASPART 100 UNIT/ML ~~LOC~~ SOLN: SUBCUTANEOUS | Status: DC

## 2015-11-24 MED ORDER — ACCU-CHEK AVIVA DEVI: Status: AC

## 2015-11-24 MED ORDER — ACCU-CHEK SOFT TOUCH LANCETS MISC: Status: DC

## 2015-11-24 NOTE — Progress Notes (Signed)
Patient ID: Justin [name], male   DOB: 06/22/1996, 19 y.o.   MRN: [id number] SUBJECTIVE: 19 y.o. male for follow up of type 1 diabetes. Patient has not been seen by any provider for his diabetes in 11 months. He reports that although he has not been seen he has continued to take his insulin. He states that he has not taken any insulin yesterday or today.  He has been to nutrition and diabetes education in the past and does practice carb counting sometimes. He reports that he does enjoy eating sugary snacks especially when he is out with his friends. He currently takes 46 units of Lantus and 15 units of Novolog with meals. He endorses polyuria but denies neuropathy, blurred vision, dizziness, or hypoglycemic events.  Current Outpatient Prescriptions  Medication Sig Dispense Refill  . insulin aspart (NOVOLOG) 100 UNIT/ML injection Take per your sliding scale instructions 10 mL 4  . glucose blood test strip Use as instructed 100 each 12  . glucose monitoring kit (FREESTYLE) monitoring kit 1 each by Does not apply route as needed. Check TID 1 each 0  . ibuprofen (ADVIL,MOTRIN) 800 MG tablet Take 1 tablet (800 mg total) by mouth 3 (three) times daily. 21 tablet 0  . insulin aspart (NOVOLOG FLEXPEN) 100 UNIT/ML FlexPen INJECT 1-50 UNITS INTO THE SKIN DAILY. 50 pen 3  . Insulin Glargine (LANTUS SOLOSTAR) 100 UNIT/ML Solostar Pen UP TO 50 UNITS PER DAY AS DIRECTED BY MD 15 mL 3  . INSULIN SYRINGE 1CC/29G 29G X 1/2" 1 ML MISC Use for sliding scale coverage as directed. 100 each 2  . Lancets (FREESTYLE) lancets Use as instructed 100 each 12   No current facility-administered medications for this visit.    OBJECTIVE: Appearance: alert, well appearing, and in no distress, oriented to person, place, and time and overweight. BP 122/78 mmHg  Pulse 91  Temp(Src) 98 F (36.7 C)  Resp 16  Ht 5' 7"  (1.702 m)  Wt 218 lb 12.8 oz (99.247 kg)  BMI 34.26 kg/m2  SpO2 100%  Exam: heart sounds normal rate, regular  rhythm, normal S1, S2, no murmurs, rubs, clicks or gallops, no JVD, chest clear, no carotid bruits, feet: warm, good capillary refill, dry cracking heels, no trophic changes or ulcerative lesions, normal DP and PT pulses, normal monofilament exam and normal sensory exam  ASSESSMENT: Maguire was seen today for follow-up.  Diagnoses and all orders for this visit:  Uncontrolled type 1 diabetes mellitus without complication ([facility]) -     Glucose (CBG) -     HgB A1c -     Urinalysis Dipstick -     Microalbumin, urine -     Blood Glucose Monitoring Suppl (ACCU-CHEK AVIVA) device; Check sugars ACHS for E10.9 -     glucose blood (ACCU-CHEK AVIVA) test strip; Check sugars ACHS for E10.9 -     Lancets (ACCU-CHEK SOFT TOUCH) lancets; Check sugars ACHS for E10.9 -     insulin aspart (NOVOLOG) 100 UNIT/ML injection; Take per your sliding scale instructions -     Lipid panel -     Basic Metabolic Panel -     Flu Vaccine QUAD 36+ mos PF IM (Fluarix & Fluzone Quad PF) -     Ambulatory referral to Endocrinology Patients diabetes remains uncontrolled as evidence by hemoglobin a1c >8.  Patient has been non-compliant with medication regimen and diet. Stressed the multiple complications associated with uncontrolled diabetes.  Patient will stay on current medication dose and report back to  clinic with cbg log in 4 weeks. He states that he will be out of town for 3 weeks soon, he will hopefully have a appointment with Endocrinology when he returns.  Hyperglycemia -     POCT glucose (manual entry) -     insulin aspart (novoLOG) injection 10 Units; Inject 0.1 mLs (10 Units total) into the skin once in office   Return in about 4 weeks (around 12/22/2015) for Diabetes Mellitus f/u.  Lance Bosch, NP 11/24/2015 1:17 PM

## 2015-11-24 NOTE — Progress Notes (Signed)
Patient here for follow up on his diabetes Presents in office with elevated blood sugar But stated he did not take his lantus last night or his novolog to day 10 units novolog given per office protocol

## 2015-11-25 ENCOUNTER — Telehealth: Payer: Self-pay

## 2015-11-25 LAB — MICROALBUMIN, URINE: Microalb, Ur: 0.3 mg/dL

## 2015-11-25 MED ORDER — ATORVASTATIN CALCIUM 10 MG PO TABS: 10.0000 mg | ORAL_TABLET | Freq: Every day | ORAL | Status: DC

## 2015-11-25 NOTE — Telephone Encounter (Signed)
-----   Message from Ambrose FinlandValerie A Keck, NP sent at 11/25/2015 11:03 AM EST ----- Cholesterol is really elevated. Please go over things that increase cholesterol levels such as breads pasta, rice, butters, fried foods, etc. Please send Lipitor 10 mg to take every evening with dinner. Please explain that high cholesterol places him at risk for stroke and heart disease. Weight loss and exercise

## 2015-11-25 NOTE — Telephone Encounter (Signed)
Spoke with patient this am and he is aware of his lab results and  [facility]

## 2015-12-01 ENCOUNTER — Encounter: Payer: Self-pay | Admitting: Internal Medicine

## 2016-06-03 ENCOUNTER — Encounter (HOSPITAL_COMMUNITY): Payer: Self-pay | Admitting: Emergency Medicine

## 2016-06-03 ENCOUNTER — Ambulatory Visit (HOSPITAL_COMMUNITY)
Admission: EM | Admit: 2016-06-03 | Discharge: 2016-06-03 | Disposition: A | Discharge status: Home / Self Care | Payer: Medicaid Other | Attending: Emergency Medicine | Admitting: Emergency Medicine

## 2016-06-03 DIAGNOSIS — T23101A Burn of first degree of right hand, unspecified site, initial encounter: Secondary | ICD-10-CM

## 2016-06-03 MED ORDER — TRAMADOL HCL 50 MG PO TABS: ORAL_TABLET | ORAL | Status: DC

## 2016-06-03 MED ORDER — SILVER SULFADIAZINE 1 % EX CREA
TOPICAL_CREAM | Freq: Once | CUTANEOUS | Status: AC
  Administered 2016-06-03: 20:00:00 via TOPICAL

## 2016-06-03 MED ORDER — IBUPROFEN 800 MG PO TABS
800.0000 mg | ORAL_TABLET | Freq: Once | ORAL | Status: AC
  Administered 2016-06-03: 800 mg via ORAL

## 2016-06-03 MED ORDER — IBUPROFEN 800 MG PO TABS
ORAL_TABLET | ORAL | Status: AC
  Filled 2016-06-03: qty 1

## 2016-06-03 MED ORDER — SILVER SULFADIAZINE 1 % EX CREA
TOPICAL_CREAM | CUTANEOUS | Status: AC
Start: 2016-06-03 — End: 2016-06-03
  Filled 2016-06-03: qty 85
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
## 2016-06-03 NOTE — Discharge Instructions (Signed)
Burn Care Apply Silvadene cream to the burn once daily for the next 3 days and cover with a thin dressing. Apply cold compresses over the dressing. Take ibuprofen 800 mg every 8 hours for the next 2 days. Tramadol for pain as directed. If she continued to have pain even with cold compresses over the dressing you may remove the dressing and adverse the hand in cold water. Keep the burn clean. Do not apply any type of ointment or other topical treatments except for the Silvadene. After 2-3 days it is unlikely you will need to apply dressing unless you have a blister or broken blister. For any signs of infection return for evaluation and treatment. Your skin is a natural barrier to infection. It is the largest organ of your body. Burns damage this natural protection. To help prevent infection, it is very important to follow your caregiver's instructions in the care of your burn. Burns are classified as:  First degree. There is only redness of the skin (erythema). No scarring is expected.  Second degree. There is blistering of the skin. Scarring may occur with deeper burns.  Third degree. All layers of the skin are injured, and scarring is expected. HOME CARE INSTRUCTIONS   Wash your hands well before changing your bandage.  Change your bandage as often as directed by your caregiver.  Remove the old bandage. If the bandage sticks, you may soak it off with cool, clean water.  Cleanse the burn thoroughly but gently with mild soap and water.  Pat the area dry with a clean, dry cloth.  Apply a thin layer of antibacterial cream to the burn.  Apply a clean bandage as instructed by your caregiver.  Keep the bandage as clean and dry as possible.  Elevate the affected area for the first 24 hours, then as instructed by your caregiver.  Only take over-the-counter or prescription medicines for pain, discomfort, or fever as directed by your caregiver. SEEK IMMEDIATE MEDICAL CARE IF:   You develop  excessive pain.  You develop redness, tenderness, swelling, or red streaks near the burn.  The burned area develops yellowish-white fluid (pus) or a bad smell.  You have a fever. MAKE SURE YOU:   Understand these instructions.  Will watch your condition.  Will get help right away if you are not doing well or get worse.   This information is not intended to replace advice given to you by your health care provider. Make sure you discuss any questions you have with your health care provider.   Document Released: 11/19/2005 Document Revised: 02/11/2012 Document Reviewed: 04/11/2011 Elsevier Interactive Patient Education [employer]2016 Elsevier Inc.
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
## 2016-07-16 ENCOUNTER — Emergency Department (HOSPITAL_COMMUNITY): Payer: Medicaid Other

## 2016-07-16 ENCOUNTER — Inpatient Hospital Stay (HOSPITAL_COMMUNITY)
Admission: EM | Admit: 2016-07-16 | Discharge: 2016-07-19 | DRG: 638 | Disposition: A | Discharge status: Home / Self Care | Payer: Medicaid Other | Attending: Internal Medicine | Admitting: Internal Medicine

## 2016-07-16 ENCOUNTER — Encounter (HOSPITAL_COMMUNITY): Payer: Self-pay | Admitting: Nurse Practitioner

## 2016-07-16 DIAGNOSIS — Z794 Long term (current) use of insulin: Secondary | ICD-10-CM

## 2016-07-16 DIAGNOSIS — Z9114 Patient's other noncompliance with medication regimen: Secondary | ICD-10-CM | POA: Diagnosis not present

## 2016-07-16 DIAGNOSIS — E876 Hypokalemia: Secondary | ICD-10-CM | POA: Diagnosis present

## 2016-07-16 DIAGNOSIS — F329 Major depressive disorder, single episode, unspecified: Secondary | ICD-10-CM | POA: Diagnosis present

## 2016-07-16 DIAGNOSIS — E871 Hypo-osmolality and hyponatremia: Secondary | ICD-10-CM | POA: Diagnosis present

## 2016-07-16 DIAGNOSIS — E109 Type 1 diabetes mellitus without complications: Secondary | ICD-10-CM

## 2016-07-16 DIAGNOSIS — E1010 Type 1 diabetes mellitus with ketoacidosis without coma: Secondary | ICD-10-CM | POA: Diagnosis present

## 2016-07-16 DIAGNOSIS — Z7982 Long term (current) use of aspirin: Secondary | ICD-10-CM

## 2016-07-16 LAB — CBC
HCT: 44 % (ref 39.0–52.0)
Hemoglobin: 14.7 g/dL (ref 13.0–17.0)
MCH: 27.4 pg (ref 26.0–34.0)
MCHC: 33.4 g/dL (ref 30.0–36.0)
MCV: 81.9 fL (ref 78.0–100.0)
PLATELETS: 354 10*3/uL (ref 150–400)
RBC: 5.37 MIL/uL (ref 4.22–5.81)
RDW: 13.3 % (ref 11.5–15.5)
WBC: 12 10*3/uL — ABNORMAL HIGH (ref 4.0–10.5)

## 2016-07-16 LAB — CBG MONITORING, ED
GLUCOSE-CAPILLARY: 443 mg/dL — AB (ref 65–99)
GLUCOSE-CAPILLARY: 404 mg/dL — AB (ref 65–99)
Glucose-Capillary: 323 mg/dL — ABNORMAL HIGH (ref 65–99)

## 2016-07-16 LAB — BLOOD GAS, VENOUS
ACID-BASE DEFICIT: 19.7 mmol/L — AB (ref 0.0–2.0)
BICARBONATE: 9.1 meq/L — AB (ref 20.0–24.0)
FIO2: 21
O2 SAT: 74.4 %
PO2 VEN: 46.1 mmHg — AB (ref 31.0–45.0)
Patient temperature: 98
TCO2: 8.7 mmol/L (ref 0–100)
pCO2, Ven: 29.1 mmHg — ABNORMAL LOW (ref 45.0–50.0)
pH, Ven: 7.121 — CL (ref 7.250–7.300)

## 2016-07-16 LAB — URINALYSIS, ROUTINE W REFLEX MICROSCOPIC
BILIRUBIN URINE: NEGATIVE
Glucose, UA: >1000 mg/dL — AB
Hgb urine dipstick: NEGATIVE
Leukocytes, UA: NEGATIVE
NITRITE: NEGATIVE
Protein, ur: NEGATIVE mg/dL
Specific Gravity, Urine: 1.031 — ABNORMAL HIGH (ref 1.005–1.030)
pH: 5 (ref 5.0–8.0)

## 2016-07-16 LAB — BASIC METABOLIC PANEL
ANION GAP: 21 — AB (ref 5–15)
BUN: 20 mg/dL (ref 6–20)
CALCIUM: 10.1 mg/dL (ref 8.9–10.3)
CO2: 11 mmol/L — ABNORMAL LOW (ref 22–32)
CREATININE: 1.01 mg/dL (ref 0.61–1.24)
Chloride: 102 mmol/L (ref 101–111)
GFR calc Af Amer: >60 mL/min (ref >60)
GLUCOSE: 445 mg/dL — AB (ref 65–99)
Potassium: 4.4 mmol/L (ref 3.5–5.1)
Sodium: 134 mmol/L — ABNORMAL LOW (ref 135–145)

## 2016-07-16 LAB — URINE MICROSCOPIC-ADD ON
BACTERIA UA: NONE SEEN
RBC / HPF: NONE SEEN RBC/hpf (ref 0–5)
WBC, UA: NONE SEEN WBC/hpf (ref 0–5)

## 2016-07-16 LAB — I-STAT TROPONIN, ED: Troponin i, poc: 0 ng/mL (ref 0.00–0.08)

## 2016-07-16 MED ORDER — SODIUM CHLORIDE 0.9 % IV SOLN
INTRAVENOUS | Status: DC
  Administered 2016-07-16: 3.4 [IU]/h via INTRAVENOUS
  Filled 2016-07-16: qty 2.5

## 2016-07-16 MED ORDER — INSULIN ASPART 100 UNIT/ML ~~LOC~~ SOLN: 0.0000 [IU] | SUBCUTANEOUS | Status: DC

## 2016-07-16 MED ORDER — SODIUM CHLORIDE 0.9 % IV SOLN: INTRAVENOUS | Status: DC

## 2016-07-16 MED ORDER — SODIUM CHLORIDE 0.9 % IV BOLUS (SEPSIS)
1000.0000 mL | Freq: Once | INTRAVENOUS | Status: AC
  Administered 2016-07-16: 1000 mL via INTRAVENOUS

## 2016-07-16 MED ORDER — KETOROLAC TROMETHAMINE 30 MG/ML IJ SOLN
15.0000 mg | Freq: Once | INTRAMUSCULAR | Status: AC
  Administered 2016-07-16: 15 mg via INTRAVENOUS
  Filled 2016-07-16: qty 1

## 2016-07-16 MED ORDER — DEXTROSE 50 % IV SOLN: 25.0000 mL | INTRAVENOUS | Status: DC | PRN

## 2016-07-16 MED ORDER — INSULIN REGULAR HUMAN 100 UNIT/ML IJ SOLN
INTRAMUSCULAR | Status: DC
  Filled 2016-07-16: qty 2.5

## 2016-07-16 MED ORDER — DEXTROSE-NACL 5-0.45 % IV SOLN
INTRAVENOUS | Status: DC
  Administered 2016-07-17: 01:00:00 via INTRAVENOUS

## 2016-07-16 MED ORDER — INSULIN REGULAR BOLUS VIA INFUSION
0.0000 [IU] | Freq: Three times a day (TID) | INTRAVENOUS | Status: DC
  Filled 2016-07-16: qty 10

## 2016-07-16 MED ORDER — SODIUM CHLORIDE 0.9 % IV SOLN
INTRAVENOUS | Status: DC
  Administered 2016-07-16: 22:00:00 via INTRAVENOUS

## 2016-07-16 MED ORDER — SODIUM CHLORIDE 0.9 % IV SOLN: INTRAVENOUS | Status: DC | PRN

## 2016-07-16 MED ORDER — ONDANSETRON HCL 4 MG/2ML IJ SOLN
4.0000 mg | Freq: Once | INTRAMUSCULAR | Status: AC
  Administered 2016-07-16: 4 mg via INTRAVENOUS
  Filled 2016-07-16: qty 2

## 2016-07-16 NOTE — ED Notes (Signed)
Bed: WA09 Expected date:  Expected time:  Means of arrival:  Comments: hyperglycemia

## 2016-07-16 NOTE — ED Triage Notes (Signed)
Pt is presented by EMS for evaluation of hyperglycemia. Denies N/V/D, abdominal pain, c/o polydipsia.

## 2016-07-16 NOTE — ED Provider Notes (Signed)
[facility]-EMERGENCY DEPT Provider Note   CSN: [id number] Arrival date & time: 07/16/16  2008     History   Chief Complaint Chief Complaint  Patient presents with  . Hyperglycemia    HPI Justin [name] is a 20 y.o. male.  Justin [name] is a 20 y.o. male asthma, depression, T1DM noncompliant with treatment presents to ED with complaint of hyperglycemia. Patient states yesterday he started experiencing nausea, vomiting, lightheadedness, and dizziness. Patient does not remember what his blood sugars were yesterday. He reports taking 15 units of insulin yesterday. Today, he endorses associated shortness of breath stating "swelling in my throat," vomiting x 4 episodes, sharp/intermittent/central chest pain worse with movement, gradual onset of pressure like frontal headache with associated photophobia, and questionable LOC - patient reports going up to take a nap today, but does not remember laying down. He denies fever, cough, URI like sxs, abdominal pain, diarrhea, constipation, dysuria, hematuria, head trauma, numbness, weakness, or rashes.       Past Medical History:  Diagnosis Date  . Allergy    allergic rhinitis  . Asthma    as a child, hospitalized at age 89  . Depression   . Diabetes mellitus without complication (HCC)   . Obesity     Patient Active Problem List   Diagnosis Date Noted  . DKA, type 1 (HCC) 07/16/2016  . Type 1 diabetes mellitus, uncontrolled (HCC) 11/13/2012  . Noncompliance with diabetes treatment 11/04/2012  . Hyperglycemia 11/04/2012  . Depression 10/27/2012  . 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 Adjustment reaction 09/26/2012  . Goiter 09/26/2012  . Type 1 diabetes mellitus on insulin therapy (HCC) 08/05/2012  . ASTHMA 09/02/2007    Past Surgical History:  Procedure Laterality Date  . CIRCUMCISION         Home Medications    Prior to Admission medications   Medication Sig Start Date End Date Taking? Authorizing Provider  aspirin EC 81 MG  tablet Take 324 mg by mouth daily as needed (headaches).    Yes Historical Provider, MD  insulin aspart (NOVOLOG FLEXPEN) 100 UNIT/ML FlexPen INJECT 1-50 UNITS INTO THE SKIN DAILY. Patient taking differently: Inject 5-15 Units into the skin 3 (three) times daily with meals. Increase by 1 unit per 10 carbs 03/24/15  Yes Olugbemiga E Hyman HopesJegede, MD  Insulin Glargine (LANTUS SOLOSTAR) 100 UNIT/ML Solostar Pen UP TO 50 UNITS PER DAY AS DIRECTED BY MD 10/11/15  Yes Quentin Angstlugbemiga E Jegede, MD  Blood Glucose Monitoring Suppl (ACCU-CHEK AVIVA) device Check sugars ACHS for E10.9 11/24/15 11/23/16  Ambrose FinlandValerie A Keck, NP  glucose blood (ACCU-CHEK AVIVA) test strip Check sugars ACHS for E10.9 11/24/15   Ambrose FinlandValerie A Keck, NP  INSULIN SYRINGE 1CC/29G 29G X 1/2" 1 ML MISC Use for sliding scale coverage as directed. 11/05/14   Felicie Mornavid Smith, NP  Lancets (ACCU-CHEK SOFT TOUCH) lancets Check sugars ACHS for E10.9 11/24/15   Ambrose FinlandValerie A Keck, NP  traMADol (ULTRAM) 50 MG tablet 1-2 tabs po q 6 hr prn pain Maximum dose= 8 tablets per day Patient not taking: Reported on 07/16/2016 06/03/16   Hayden Rasmussenavid Mabe, NP    Family History Family History  Problem Relation Age of Onset  . Cancer Neg Hx   . Diabetes Neg Hx   . Heart failure Neg Hx   . Hyperlipidemia Neg Hx   . Thyroid disease Neg Hx   . Stroke Neg Hx     Social History Social History  Substance Use Topics  . Smoking status: Never Smoker  .  Smokeless tobacco: Never Used  . Alcohol use No     Allergies   Review of patient's allergies indicates no known allergies.   Review of Systems Review of Systems  Constitutional: Negative for fever.  HENT: Negative for congestion, rhinorrhea and sore throat.   Eyes: Positive for photophobia. Negative for visual disturbance.  Respiratory: Positive for shortness of breath. Negative for cough and wheezing.   Cardiovascular: Positive for chest pain ( intermittent, none currently).  Gastrointestinal: Positive for nausea and vomiting.  Negative for abdominal pain, constipation and diarrhea.  Endocrine: Positive for polyuria.  Genitourinary: Negative for dysuria and hematuria.  Skin: Negative for rash.  Neurological: Positive for dizziness, light-headedness and headaches. Negative for weakness and numbness.     Physical Exam Updated Vital Signs BP 138/72 (BP Location: Right Arm)   Pulse 93   Temp 98 F (36.7 C) (Oral)   Resp 20   SpO2 97%   Physical Exam  Constitutional: He appears well-developed and well-nourished. He appears lethargic. No distress.  HENT:  Head: Normocephalic and atraumatic.  Mouth/Throat: Uvula is midline and oropharynx is clear and moist. Mucous membranes are dry. No trismus in the jaw. No uvula swelling. No oropharyngeal exudate, posterior oropharyngeal edema, posterior oropharyngeal erythema or tonsillar abscesses.  Eyes: Conjunctivae and EOM are normal. Pupils are equal, round, and reactive to light. Right eye exhibits no discharge. Left eye exhibits no discharge. No scleral icterus.  Neck: Normal range of motion. Neck supple. No spinous process tenderness present. No neck rigidity. Normal range of motion present.  Cardiovascular: Normal rate, regular rhythm, normal heart sounds and intact distal pulses.   No murmur heard. Pulmonary/Chest: Effort normal and breath sounds normal. No respiratory distress.  Abdominal: Soft. Bowel sounds are normal. There is no tenderness. There is no rigidity, no rebound and no guarding.  Musculoskeletal: Normal range of motion.  Lymphadenopathy:    He has no cervical adenopathy.  Neurological: He appears lethargic. He is not disoriented. Coordination normal.  Mental Status:  Mildly lethargic, thought content appropriate, able to give a coherent history. Speech fluent without evidence of aphasia. Able to follow 2 step commands without difficulty.  Cranial Nerves:  II:  Peripheral visual fields grossly normal, pupils equal, round, reactive to light III,IV, VI:  ptosis not present, extra-ocular motions intact bilaterally  V,VII: smile symmetric, facial light touch sensation equal VIII: hearing grossly normal to voice  X: uvula elevates symmetrically  XI: bilateral shoulder shrug symmetric and strong XII: midline tongue extension without fassiculations Motor:  Normal tone. 5/5 in upper and lower extremities bilaterally including strong and equal grip strength and dorsiflexion/plantar flexion Sensory: light touch normal in all extremities. Cerebellar: normal finger-to-nose with bilateral upper extremities CV: distal pulses palpable throughout   Skin: Skin is warm and dry. He is not diaphoretic.  Psychiatric: He has a normal mood and affect.     ED Treatments / Results  Labs (all labs ordered are listed, but only abnormal results are displayed) Labs Reviewed  BASIC METABOLIC PANEL - Abnormal; Notable for the following:       Result Value   Sodium 134 (*)    CO2 11 (*)    Glucose, Bld 445 (*)    Anion gap 21 (*)    All other components within normal limits  CBC - Abnormal; Notable for the following:    WBC 12.0 (*)    All other components within normal limits  URINALYSIS, ROUTINE W REFLEX MICROSCOPIC (NOT AT [facility]) - Abnormal;  Notable for the following:    Specific Gravity, Urine 1.031 (*)    Glucose, UA >1000 (*)    Ketones, ur >80 (*)    All other components within normal limits  BLOOD GAS, VENOUS - Abnormal; Notable for the following:    pH, Ven 7.121 (*)    pCO2, Ven 29.1 (*)    pO2, Ven 46.1 (*)    Bicarbonate 9.1 (*)    Acid-base deficit 19.7 (*)    All other components within normal limits  URINE MICROSCOPIC-ADD ON - Abnormal; Notable for the following:    Squamous Epithelial / LPF 0-5 (*)    All other components within normal limits  CBG MONITORING, ED - Abnormal; Notable for the following:    Glucose-Capillary 443 (*)    All other components within normal limits  CBG MONITORING, ED - Abnormal; Notable for the following:      Glucose-Capillary 404 (*)    All other components within normal limits  I-STAT TROPOININ, ED    EKG  EKG Interpretation None       Radiology Dg Chest 2 View  Result Date: 07/16/2016 CLINICAL DATA:  Shortness of breath, chest pain, nausea and vomiting, and weakness, onset today. Hyperglycemia. EXAM: CHEST  2 VIEW COMPARISON:  None. FINDINGS: The heart size and mediastinal contours are within normal limits. Both lungs are clear. The visualized skeletal structures are unremarkable. IMPRESSION: No active cardiopulmonary disease. Electronically Signed   By: Burman NievesWilliam  Stevens M.D.   On: 07/16/2016 21:54    Procedures Procedures (including critical care time)  Medications Ordered in ED Medications  sodium chloride 0.9 % bolus 1,000 mL (0 mLs Intravenous Stopped 07/16/16 2211)    And  0.9 %  sodium chloride infusion ( Intravenous New Bag/Given 07/16/16 2211)  dextrose 5 %-0.45 % sodium chloride infusion ( Intravenous Hold 07/16/16 2211)  insulin regular bolus via infusion 0-10 Units (not administered)  insulin regular (NOVOLIN R,HUMULIN R) 250 Units in sodium chloride 0.9 % 250 mL (1 Units/mL) infusion (3.4 Units/hr Intravenous New Bag/Given 07/16/16 2230)  dextrose 50 % solution 25 mL (not administered)  ondansetron (ZOFRAN) injection 4 mg (4 mg Intravenous Given 07/16/16 2229)  ketorolac (TORADOL) 30 MG/ML injection 15 mg (15 mg Intravenous Given 07/16/16 2230)     Initial Impression / Assessment and Plan / ED Course  I have reviewed the triage vital signs and the nursing notes.  Pertinent labs & imaging results that were available during my care of the patient were reviewed by me and considered in my medical decision making (see chart for details).  Clinical Course  Value Comment By Time  DG Chest 2 View Normal cardiac silhouette. No evidence of PNA, pleural effusion, or PTX. No free air under diaphragm.  Lona Kettleshley Laurel Lemon [place], [place]C 08/14 2321   On re-evaluation, patient is sleeping  comfortably. He endorses improvement in sxs.  Lona Kettleshley Laurel Chritopher Coster, [place]C 08/14 2322  EKG 12-Lead EKG shows sinus rhythm. Normal intervals. No axis deviation. No hypertrophy. ST elevation, like early repolarization [facility] La[facility] Akiah Bauch, PA-C 08/14 2323  pH, Ven: (!!) 7.121 VBG indicative of of metabolic acidosis. Lona Kettleshley Laurel Parris Signer, [place]C 08/14 2324    Patient is afebrile and non-toxic appearing. He is somewhat lethargic, although able to provide a history. Vital signs are stable. Respirations are unlabored. O2 sats normal. No Stridor. Patient drinking fluids without difficulty. Physical exam remarkable for dry mucous membranes. No focal neuro deficits. Labs concerning for DKA with AG of 21. U/A negative for  UTI. Elevated WBC - infectious vs. Stress response. Given intermittent CP and SOB - check EKG, troponin, CXR. Low bicarb - check VBG. IVF bolus + infusion and insulin infusion initiated. Zofran and toradol given for headache. Given DKA will likely admit.   CXR negative for acute cardiopulmonary process. EKG sinus rhythm. Troponin negative. VBG remarkable for metabolic acidosis. Consult to hospitalist for admission.    11:09 PM: Spoke with Dr. Toniann Fail of [facility], appreciate his time and input. Agree to admit patient for further management of DKA.    Final Clinical Impressions(s) / ED Diagnoses   Final diagnoses:  Diabetic ketoacidosis without coma associated with type 1 diabetes mellitus [facility])    New Prescriptions New Prescriptions   No medications on file     Lona Kettle, PA-C 07/16/16 2329    Rolland Porter, MD 07/17/16 [phone]

## 2016-07-17 ENCOUNTER — Encounter (HOSPITAL_COMMUNITY): Payer: Self-pay | Admitting: Internal Medicine

## 2016-07-17 LAB — GLUCOSE, CAPILLARY
GLUCOSE-CAPILLARY: 335 mg/dL — AB (ref 65–99)
GLUCOSE-CAPILLARY: 276 mg/dL — AB (ref 65–99)
GLUCOSE-CAPILLARY: 207 mg/dL — AB (ref 65–99)
GLUCOSE-CAPILLARY: 199 mg/dL — AB (ref 65–99)
GLUCOSE-CAPILLARY: 218 mg/dL — AB (ref 65–99)
GLUCOSE-CAPILLARY: 261 mg/dL — AB (ref 65–99)
GLUCOSE-CAPILLARY: 333 mg/dL — AB (ref 65–99)
GLUCOSE-CAPILLARY: 218 mg/dL — AB (ref 65–99)
GLUCOSE-CAPILLARY: 204 mg/dL — AB (ref 65–99)
Glucose-Capillary: 234 mg/dL — ABNORMAL HIGH (ref 65–99)
Glucose-Capillary: 314 mg/dL — ABNORMAL HIGH (ref 65–99)
Glucose-Capillary: 178 mg/dL — ABNORMAL HIGH (ref 65–99)
Glucose-Capillary: 206 mg/dL — ABNORMAL HIGH (ref 65–99)
Glucose-Capillary: 176 mg/dL — ABNORMAL HIGH (ref 65–99)
Glucose-Capillary: 218 mg/dL — ABNORMAL HIGH (ref 65–99)
Glucose-Capillary: 208 mg/dL — ABNORMAL HIGH (ref 65–99)
Glucose-Capillary: 211 mg/dL — ABNORMAL HIGH (ref 65–99)

## 2016-07-17 LAB — RAPID URINE DRUG SCREEN, HOSP PERFORMED
Amphetamines: NOT DETECTED
BENZODIAZEPINES: NOT DETECTED
Barbiturates: NOT DETECTED
COCAINE: NOT DETECTED
Opiates: NOT DETECTED
Tetrahydrocannabinol: NOT DETECTED

## 2016-07-17 LAB — BASIC METABOLIC PANEL
ANION GAP: 15 (ref 5–15)
Anion gap: 20 — ABNORMAL HIGH (ref 5–15)
Anion gap: 13 (ref 5–15)
BUN: 16 mg/dL (ref 6–20)
BUN: 16 mg/dL (ref 6–20)
BUN: 15 mg/dL (ref 6–20)
CALCIUM: 8.6 mg/dL — AB (ref 8.9–10.3)
CALCIUM: 8.3 mg/dL — AB (ref 8.9–10.3)
CHLORIDE: 105 mmol/L (ref 101–111)
CO2: 7 mmol/L — ABNORMAL LOW (ref 22–32)
CO2: 11 mmol/L — ABNORMAL LOW (ref 22–32)
CO2: 10 mmol/L — ABNORMAL LOW (ref 22–32)
CREATININE: 1.03 mg/dL (ref 0.61–1.24)
CREATININE: 0.95 mg/dL (ref 0.61–1.24)
CREATININE: 0.9 mg/dL (ref 0.61–1.24)
Calcium: 8.8 mg/dL — ABNORMAL LOW (ref 8.9–10.3)
Chloride: 109 mmol/L (ref 101–111)
Chloride: 107 mmol/L (ref 101–111)
GFR calc Af Amer: >60 mL/min (ref >60)
GFR calc non Af Amer: >60 mL/min (ref >60)
Glucose, Bld: 324 mg/dL — ABNORMAL HIGH (ref 65–99)
Glucose, Bld: 191 mg/dL — ABNORMAL HIGH (ref 65–99)
Glucose, Bld: 197 mg/dL — ABNORMAL HIGH (ref 65–99)
POTASSIUM: 4.1 mmol/L (ref 3.5–5.1)
Potassium: 4.6 mmol/L (ref 3.5–5.1)
Potassium: 4.7 mmol/L (ref 3.5–5.1)
SODIUM: 132 mmol/L — AB (ref 135–145)
SODIUM: 132 mmol/L — AB (ref 135–145)
Sodium: 133 mmol/L — ABNORMAL LOW (ref 135–145)

## 2016-07-17 LAB — CBG MONITORING, ED
Glucose-Capillary: 237 mg/dL — ABNORMAL HIGH (ref 65–99)
Glucose-Capillary: 230 mg/dL — ABNORMAL HIGH (ref 65–99)

## 2016-07-17 LAB — TSH: TSH: 0.672 u[IU]/mL (ref 0.350–4.500)

## 2016-07-17 LAB — TROPONIN I

## 2016-07-17 MED ORDER — INSULIN GLARGINE 100 UNIT/ML ~~LOC~~ SOLN
20.0000 [IU] | Freq: Every day | SUBCUTANEOUS | Status: DC
  Administered 2016-07-17: 20 [IU] via SUBCUTANEOUS
  Filled 2016-07-17: qty 0.2

## 2016-07-17 MED ORDER — POTASSIUM CHLORIDE 10 MEQ/100ML IV SOLN
10.0000 meq | INTRAVENOUS | Status: AC
  Administered 2016-07-17 (×5): 10 meq via INTRAVENOUS
  Filled 2016-07-17 (×2): qty 100

## 2016-07-17 MED ORDER — PHENOL 1.4 % MT LIQD
1.0000 | OROMUCOSAL | Status: DC | PRN
  Filled 2016-07-17: qty 177

## 2016-07-17 MED ORDER — SODIUM CHLORIDE 0.9 % IV SOLN
INTRAVENOUS | Status: DC
  Administered 2016-07-17 – 2016-07-19 (×4): via INTRAVENOUS

## 2016-07-17 MED ORDER — DEXTROSE-NACL 5-0.45 % IV SOLN
INTRAVENOUS | Status: DC
  Administered 2016-07-17 (×3): via INTRAVENOUS

## 2016-07-17 MED ORDER — ASPIRIN EC 81 MG PO TBEC
324.0000 mg | DELAYED_RELEASE_TABLET | Freq: Every day | ORAL | Status: DC | PRN
  Administered 2016-07-17 – 2016-07-18 (×2): 324 mg via ORAL
  Filled 2016-07-17 (×3): qty 4

## 2016-07-17 MED ORDER — SODIUM CHLORIDE 0.9 % IV SOLN
INTRAVENOUS | Status: DC
  Administered 2016-07-17: 3.4 [IU]/h via INTRAVENOUS
  Administered 2016-07-17: 5.1 [IU]/h via INTRAVENOUS
  Filled 2016-07-17 (×3): qty 2.5

## 2016-07-17 MED ORDER — ENOXAPARIN SODIUM 40 MG/0.4ML ~~LOC~~ SOLN
40.0000 mg | SUBCUTANEOUS | Status: DC
  Administered 2016-07-17 – 2016-07-19 (×3): 40 mg via SUBCUTANEOUS
  Filled 2016-07-17 (×3): qty 0.4

## 2016-07-17 MED ORDER — IBUPROFEN 200 MG PO TABS
400.0000 mg | ORAL_TABLET | Freq: Four times a day (QID) | ORAL | Status: DC | PRN
  Administered 2016-07-17 – 2016-07-18 (×3): 400 mg via ORAL
  Filled 2016-07-17 (×3): qty 2

## 2016-07-17 NOTE — Progress Notes (Signed)
Attempted to speak with patient. He stated that he takes Lantus 50 units daily at home. Did not give me any other dosage for Novolog when asked. He shook his head "no" when [facility].  Not much success in talking with patient to get history.  Smith MinceKendra Maddax Palinkas RN BSN CDE

## 2016-07-17 NOTE — Progress Notes (Signed)
[facility]  20 y/o male with DM for 4 yr who stopped taking his Lantus a few days ago. He was admitted by my colleague overnight. I have reviewed the chart and examined the patient.   Subjective - nausea, no vomiting or abdominal pain.   Exam: Lungs CTA, CVS RRR, no murmurs, Abd: soft, Non tender, non-distended BS +   Principal Problem:   DKA, type 1 (HCC) - cont insulin infusion until acidosis resolves- cont Bmet Q4 hrs - have given a small dose of Lantus this AM  Hyponatremia - cont IVF to hydrate  Calvert CantorSaima Reshonda Koerber, MD

## 2016-07-17 NOTE — H&P (Signed)
History and Physical    Justin DimesJairo [name] GMW:[id number] DOB: 03-06-1996 DOA: 07/16/2016  PCP: Ambrose FinlandValerie A Keck, NP  Patient coming from: Home.  Chief Complaint: Elevated blood sugar.  HPI: Justin DimesJairo [name] is a 20 y.o. male with diabetes mellitus type 1 presents to the ER because of elevated blood sugar. Patient also had 2 episodes of nausea and vomiting. Denies any chest pain shortness of breath abdominal pain or diarrhea. In the ER patient's blood sugar was more than 400 with bicarbonate of 11 and an elevated anion gap. Patient states last few days he has not taken his Lantus. Not able to clearly state the reason why he did not take. Patient is started on fluid bolus and IV insulin infusion for DKA and admitted for further observation.   ED Course: Patient was given 2 L normal saline bolus following which patient was started on IV insulin infusion.  Review of Systems: As per HPI, rest all negative.   Past Medical History:  Diagnosis Date  . Allergy    allergic rhinitis  . Asthma    as a child, hospitalized at age 89  . Depression   . Diabetes mellitus without complication (HCC)   . Obesity     Past Surgical History:  Procedure Laterality Date  . CIRCUMCISION       reports that he has never smoked. He has never used smokeless tobacco. He reports that he uses drugs, including Marijuana. He reports that he does not drink alcohol.  No Known Allergies  Family History  Problem Relation Age of Onset  . Cancer Neg Hx   . Diabetes Neg Hx   . Heart failure Neg Hx   . Hyperlipidemia Neg Hx   . Thyroid disease Neg Hx   . Stroke Neg Hx     Prior to Admission medications   Medication Sig Start Date End Date Taking? Authorizing Provider  aspirin EC 81 MG tablet Take 324 mg by mouth daily as needed (headaches).    Yes Historical Provider, MD  insulin aspart (NOVOLOG FLEXPEN) 100 UNIT/ML FlexPen INJECT 1-50 UNITS INTO THE SKIN DAILY. Patient taking differently: Inject 5-15 Units into the  skin 3 (three) times daily with meals. Increase by 1 unit per 10 carbs 03/24/15  Yes Olugbemiga E Hyman HopesJegede, MD  Insulin Glargine (LANTUS SOLOSTAR) 100 UNIT/ML Solostar Pen UP TO 50 UNITS PER DAY AS DIRECTED BY MD 10/11/15  Yes Quentin Angstlugbemiga E Jegede, MD  Blood Glucose Monitoring Suppl (ACCU-CHEK AVIVA) device Check sugars ACHS for E10.9 11/24/15 11/23/16  Ambrose FinlandValerie A Keck, NP  glucose blood (ACCU-CHEK AVIVA) test strip Check sugars ACHS for E10.9 11/24/15   Ambrose FinlandValerie A Keck, NP  INSULIN SYRINGE 1CC/29G 29G X 1/2" 1 ML MISC Use for sliding scale coverage as directed. 11/05/14   Felicie Mornavid Smith, NP  Lancets (ACCU-CHEK SOFT TOUCH) lancets Check sugars ACHS for E10.9 11/24/15   Ambrose FinlandValerie A Keck, NP  traMADol (ULTRAM) 50 MG tablet 1-2 tabs po q 6 hr prn pain Maximum dose= 8 tablets per day Patient not taking: Reported on 07/16/2016 06/03/16   Hayden Rasmussenavid Mabe, NP    Physical Exam: Vitals:   07/16/16 2030 07/16/16 2344  BP: 138/72 121/65  Pulse: 93 107  Resp: 20 20  Temp: 98 F (36.7 C)   TempSrc: Oral   SpO2: 97% 100%      Constitutional: Not in distress. Vitals:   07/16/16 2030 07/16/16 2344  BP: 138/72 121/65  Pulse: 93 107  Resp: 20 20  Temp: 98  F (36.7 C)   TempSrc: Oral   SpO2: 97% 100%   Eyes: Anicteric no pallor. ENMT: No discharge from the ears eyes nose or mouth. Neck: No mass felt better no JVD appreciated. Respiratory: No rhonchi or crepitations. Cardiovascular: S1-S2 heard. Abdomen: Soft nontender bowel sounds present. No guarding or rigidity. Musculoskeletal: No edema. Skin: No rash. Neurologic: Alert awake oriented to time place and person. Moves all extremities. Psychiatric: Appears normal.   Labs on Admission: I have personally reviewed following labs and imaging studies  CBC:  Recent Labs Lab 07/16/16 2044  WBC 12.0*  HGB 14.7  HCT 44.0  MCV 81.9  PLT 354   Basic Metabolic Panel:  Recent Labs Lab 07/16/16 2044  NA 134*  K 4.4  CL 102  CO2 11*  GLUCOSE 445*  BUN  20  CREATININE 1.01  CALCIUM 10.1   GFR: CrCl cannot be calculated (Unknown ideal weight.). Liver Function Tests: No results for input(s): AST, ALT, ALKPHOS, BILITOT, PROT, ALBUMIN in the last 168 hours. No results for input(s): LIPASE, AMYLASE in the last 168 hours. No results for input(s): AMMONIA in the last 168 hours. Coagulation Profile: No results for input(s): INR, PROTIME in the last 168 hours. Cardiac Enzymes: No results for input(s): CKTOTAL, CKMB, CKMBINDEX, TROPONINI in the last 168 hours. BNP (last 3 results) No results for input(s): PROBNP in the last 8760 hours. HbA1C: No results for input(s): HGBA1C in the last 72 hours. CBG:  Recent Labs Lab 07/16/16 2035 07/16/16 2147 07/16/16 2339 07/17/16 0108  GLUCAP 443* 404* 323* 237*   Lipid Profile: No results for input(s): CHOL, HDL, LDLCALC, TRIG, CHOLHDL, LDLDIRECT in the last 72 hours. Thyroid Function Tests: No results for input(s): TSH, T4TOTAL, FREET4, T3FREE, THYROIDAB in the last 72 hours. Anemia Panel: No results for input(s): VITAMINB12, FOLATE, FERRITIN, TIBC, IRON, RETICCTPCT in the last 72 hours. Urine analysis:    Component Value Date/Time   COLORURINE YELLOW 07/16/2016 2020   APPEARANCEUR CLEAR 07/16/2016 2020   LABSPEC 1.031 (H) 07/16/2016 2020   PHURINE 5.0 07/16/2016 2020   GLUCOSEU >1000 (A) 07/16/2016 2020   HGBUR NEGATIVE 07/16/2016 2020   BILIRUBINUR NEGATIVE 07/16/2016 2020   BILIRUBINUR neg 11/24/2015 1303   KETONESUR >80 (A) 07/16/2016 2020   PROTEINUR NEGATIVE 07/16/2016 2020   UROBILINOGEN 0.2 11/24/2015 1303   UROBILINOGEN 0.2 11/04/2012 1623   NITRITE NEGATIVE 07/16/2016 2020   LEUKOCYTESUR NEGATIVE 07/16/2016 2020   Sepsis Labs: @LABRCNTIP (procalcitonin:4,lacticidven:4) )No results found for this or any previous visit (from the past 240 hour(s)).   Radiological Exams on Admission: Dg Chest 2 View  Result Date: 07/16/2016 CLINICAL DATA:  Shortness of breath, chest pain,  nausea and vomiting, and weakness, onset today. Hyperglycemia. EXAM: CHEST  2 VIEW COMPARISON:  None. FINDINGS: The heart size and mediastinal contours are within normal limits. Both lungs are clear. The visualized skeletal structures are unremarkable. IMPRESSION: No active cardiopulmonary disease. Electronically Signed   By: Burman NievesWilliam  Stevens M.D.   On: 07/16/2016 21:54    EKG: Independently reviewed. Normal sinus rhythm with early repolarization changes.  Assessment/Plan Principal Problem:   DKA, type 1 (HCC)    1. DKA in type 1 diabetes mellitus - probably secondary to noncompliance with medications. Patient is presently placed on fluid bolus and continuous infusion with IV insulin infusion for DKA. Closely follow metabolic panel. Once anion gap gets corrected change to Lantus and sliding scale. Check hemoglobin A1c. Patient advised to be compliant with medications. Patient also states she  has follow-up with [facility] care at [facility]. 2. History of asthma presently not wheezing. 3. History of sick euthyroid - check TSH.   DVT prophylaxis: Lovenox. Code Status: Full code.  Family Communication: Discussed with patient.  Disposition Plan: Home.  Consults called: None.  Admission status: Observation. Telemetry.    Eduard Clos MD [facility] Pager [phone].  If 7PM-7AM, please contact night-coverage [email or web address] Password [email or web address]  07/17/2016, 1:39 AM

## 2016-07-17 NOTE — Progress Notes (Signed)
Pt. Verbalizes concern on how he is going to obtain medication once his medicaid runs out soon. Recommended social work to see pt. During hospital visit.

## 2016-07-17 NOTE — Progress Notes (Signed)
Noted that patient was admitted due to not taking his insulin at home.  Patient will need Lantus and Novolog correction scale when transitioned off the IV insulin drip.  Lantus 20 units daily is ordered and should be given 2 hours before stopping the drip. Recommend that Novolog MODERATE correction scale every 4 hours while NPO and then TID & HS should be started when IV insulin drip is stopped. Noted that a consult for case management for medication needs has been ordered. Will continue to monitor blood sugars while in the hospital. Smith MinceKendra Kacey Vicuna RN BSN CDE

## 2016-07-18 DIAGNOSIS — E1010 Type 1 diabetes mellitus with ketoacidosis without coma: Principal | ICD-10-CM

## 2016-07-18 LAB — BASIC METABOLIC PANEL
ANION GAP: 4 — AB (ref 5–15)
BUN: 19 mg/dL (ref 6–20)
CHLORIDE: 111 mmol/L (ref 101–111)
CO2: 20 mmol/L — AB (ref 22–32)
Calcium: 8.1 mg/dL — ABNORMAL LOW (ref 8.9–10.3)
Creatinine, Ser: 0.6 mg/dL — ABNORMAL LOW (ref 0.61–1.24)
GFR calc non Af Amer: >60 mL/min (ref >60)
Glucose, Bld: 125 mg/dL — ABNORMAL HIGH (ref 65–99)
Potassium: 3 mmol/L — ABNORMAL LOW (ref 3.5–5.1)
Sodium: 135 mmol/L (ref 135–145)

## 2016-07-18 LAB — GLUCOSE, CAPILLARY
GLUCOSE-CAPILLARY: 102 mg/dL — AB (ref 65–99)
GLUCOSE-CAPILLARY: 149 mg/dL — AB (ref 65–99)
GLUCOSE-CAPILLARY: 168 mg/dL — AB (ref 65–99)
GLUCOSE-CAPILLARY: 268 mg/dL — AB (ref 65–99)
GLUCOSE-CAPILLARY: 289 mg/dL — AB (ref 65–99)
Glucose-Capillary: 150 mg/dL — ABNORMAL HIGH (ref 65–99)
Glucose-Capillary: 127 mg/dL — ABNORMAL HIGH (ref 65–99)
Glucose-Capillary: 181 mg/dL — ABNORMAL HIGH (ref 65–99)
Glucose-Capillary: 192 mg/dL — ABNORMAL HIGH (ref 65–99)

## 2016-07-18 MED ORDER — POTASSIUM CHLORIDE CRYS ER 20 MEQ PO TBCR
40.0000 meq | EXTENDED_RELEASE_TABLET | Freq: Once | ORAL | Status: AC
  Administered 2016-07-18: 40 meq via ORAL
  Filled 2016-07-18: qty 2

## 2016-07-18 MED ORDER — INSULIN ASPART 100 UNIT/ML ~~LOC~~ SOLN
0.0000 [IU] | Freq: Every day | SUBCUTANEOUS | Status: DC
  Administered 2016-07-18: 3 [IU] via SUBCUTANEOUS

## 2016-07-18 MED ORDER — INSULIN ASPART 100 UNIT/ML ~~LOC~~ SOLN
0.0000 [IU] | Freq: Three times a day (TID) | SUBCUTANEOUS | Status: DC
Start: 2016-07-18 — End: 2016-07-19
  Administered 2016-07-18: 3 [IU] via SUBCUTANEOUS
  Administered 2016-07-18: 8 [IU] via SUBCUTANEOUS
  Administered 2016-07-18: 2 [IU] via SUBCUTANEOUS
  Administered 2016-07-19: 11 [IU] via SUBCUTANEOUS
  Administered 2016-07-19: 3 [IU] via SUBCUTANEOUS

## 2016-07-18 MED ORDER — INSULIN GLARGINE 100 UNIT/ML ~~LOC~~ SOLN
40.0000 [IU] | Freq: Every day | SUBCUTANEOUS | Status: DC
  Administered 2016-07-18 – 2016-07-19 (×2): 40 [IU] via SUBCUTANEOUS
  Filled 2016-07-18 (×3): qty 0.4

## 2016-07-18 MED ORDER — LIP MEDEX EX OINT
TOPICAL_OINTMENT | CUTANEOUS | Status: DC | PRN
  Administered 2016-07-18: 22:00:00 via TOPICAL
  Filled 2016-07-18: qty 7

## 2016-07-18 NOTE — Progress Notes (Signed)
Pt. CBG 102 at 0400 Insulin drip adjusted to 3.8 ml/hr. Received orders from NP to D/C Q1 CBG checks and to administer 40u of Lantus SubQ. Insulin drip to be d/c an hour after lantus was given. Pt. CBG check will be ACHS with insulin coverage. Will continue to monitor pt. Closely and carry out any new orders.

## 2016-07-18 NOTE — Progress Notes (Signed)
Inpatient Diabetes Program Recommendations  AACE/ADA: New Consensus Statement on Inpatient Glycemic Control (2015)  Target Ranges:  Prepandial:   less than 140 mg/dL      Peak postprandial:   less than 180 mg/dL (1-2 hours)      Critically ill patients:  140 - 180 mg/dL   Results for Justin [name], Justin [name] (MRN [id number]) as of 07/18/2016 11:03  Ref. Range 07/18/2016 00:07 07/18/2016 01:10 07/18/2016 02:12 07/18/2016 03:15 07/18/2016 04:11 07/18/2016 07:40  Glucose-Capillary Latest Ref Range: 65 - 99 mg/dL 782168 (H) 956150 (H) 213127 (H) 181 (H) 102 (H) 149 (H)    Admit with: DKA  History: Type 1 DM  Home DM Meds: Lantus 50 units daily       Novolog 5-15 units tidwc       Novolog 1 unit for every 10 grams Carbohydrates  Current Insulin Orders: Lantus 40 units daily      Novolog Moderate Correction Scale/ SSI (0-15 units) TID AC + HS     -Note patient transitioned off IV Insulin drip this AM.  Given 40 units Lantus at 5am and IV Insulin drip stopped.  -Per Care Everywhere Chart Review, saw that patient called [facility] on 07/09/16 for Insulin refill, however, it looks like his refill request was denied as [facility] since January of this year.  Patient has Medicaid and should be able to have all ENDO appointments covered.  Not sure why patient is not seeing his ENDO on regular basis?  -Note DM Coordinator attempted to speak with patient yesterday with little success (patient not offering answers to questions).  -Per Nursing notes, patient stated he was concerned about how he will get his insulins now that his Medicaid will run out in November (pt will be 20 years old in November).  Note Care Management has been consulted to assist with medication issues.     --Will follow patient during hospitalization--  Ambrose FinlandJeannine Johnston Myrah Strawderman RN, MSN, CDE Diabetes Coordinator Inpatient Glycemic Control Team Team Pager: [phone] (8a-5p)

## 2016-07-18 NOTE — Progress Notes (Signed)
PROGRESS NOTE    Domingo DimesJairo Hamblin  [id number] DOB: 04-Aug-1996 DOA: 07/16/2016 PCP: Ambrose FinlandValerie A Keck, NP  Brief Narrative:Jamonta Annamaria HellingCorrea is a 20 y.o. male with diabetes mellitus type 1 presents to the ER because of elevated blood sugar. Patient also had 2 episodes of nausea and vomiting. Denies any chest pain shortness of breath abdominal pain or diarrhea. In the ER patient's blood sugar was more than 400 with bicarbonate of 11 and an elevated anion gap. Patient states last few days he has not taken his Lantus. Not able to clearly state the reason why he did not take. Patient is started on fluid bolus and IV insulin infusion for DKA and admitted for further observation.   ED Course: Patient was given 2 L normal saline bolus following which patient was started on IV insulin infusion.    Assessment & Plan:   Principal Problem:   DKA, type 1 (HCC)  1-DKA, DM Not compliant with medications. Couldn't get his insulin.  CM consulted.  On lantus 40 units.  SSI.  Hyponatremia; improved.   Hypokalemia oral supplement     DVT prophylaxis: lovenox Code Status: full code.  Family Communication: care discussed with patient.  Disposition Plan: home 8-17   Consultants:   none  Procedures: none Antimicrobials:   none   Subjective: He is feeling well, I explain to him importance of using insulin.   Objective: Vitals:   07/17/16 2237 07/18/16 0214 07/18/16 0549 07/18/16 1417  BP: (!) 116/58 (!) 106/52 (!) 104/46 (!) 116/57  Pulse: 93 84 71 70  Resp: 20 20 18 18   Temp: 98.6 F (37 C) 97.7 F (36.5 C) 98.2 F (36.8 C) 98 F (36.7 C)  TempSrc: Oral Oral Oral Oral  SpO2: 100% 100% 100% 98%  Weight:      Height:        Intake/Output Summary (Last 24 hours) at 07/18/16 1706 Last data filed at 07/18/16 1500  Gross per 24 hour  Intake          2013.73 ml  Output                0 ml  Net          2013.73 ml   Filed Weights   07/17/16 0312  Weight: 88.7 kg (195 lb 8 oz)     Examination:  General exam: Appears calm and comfortable  Respiratory system: Clear to auscultation. Respiratory effort normal. Cardiovascular system: S1 & S2 heard, RRR. No JVD, murmurs, rubs, gallops or clicks. No pedal edema. Gastrointestinal system: Abdomen is nondistended, soft and nontender. No organomegaly or masses felt. Normal bowel sounds heard. Central nervous system: Alert and oriented. No focal neurological deficits. Extremities: Symmetric 5 x 5 power. Skin: No rashes, lesions or ulcers Psychiatry: Judgement and insight appear normal. Mood & affect appropriate.     Data Reviewed: I have personally reviewed following labs and imaging studies  CBC:  Recent Labs Lab 07/16/16 2044  WBC 12.0*  HGB 14.7  HCT 44.0  MCV 81.9  PLT 354   Basic Metabolic Panel:  Recent Labs Lab 07/16/16 2044 07/17/16 0554 07/17/16 0922 07/17/16 1309 07/18/16 0252  NA 134* 132* 133* 132* 135  K 4.4 4.6 4.1 4.7 3.0*  CL 102 105 109 107 111  CO2 11* 7* 11* 10* 20*  GLUCOSE 445* 324* 191* 197* 125*  BUN 20 16 16 15 19   CREATININE 1.01 1.03 0.95 0.90 0.60*  CALCIUM 10.1 8.8* 8.6* 8.3* 8.1*  GFR: Estimated Creatinine Clearance: 156.5 mL/min (by C-G formula based on SCr of 0.8 mg/dL). Liver Function Tests: No results for input(s): AST, ALT, ALKPHOS, BILITOT, PROT, ALBUMIN in the last 168 hours. No results for input(s): LIPASE, AMYLASE in the last 168 hours. No results for input(s): AMMONIA in the last 168 hours. Coagulation Profile: No results for input(s): INR, PROTIME in the last 168 hours. Cardiac Enzymes:  Recent Labs Lab 07/17/16 0702  TROPONINI <0.03   BNP (last 3 results) No results for input(s): PROBNP in the last 8760 hours. HbA1C: No results for input(s): HGBA1C in the last 72 hours. CBG:  Recent Labs Lab 07/18/16 0315 07/18/16 0411 07/18/16 0740 07/18/16 1149 07/18/16 1647  GLUCAP 181* 102* 149* 192* 289*   Lipid Profile: No results for input(s):  CHOL, HDL, LDLCALC, TRIG, CHOLHDL, LDLDIRECT in the last 72 hours. Thyroid Function Tests:  Recent Labs  07/17/16 0702  TSH 0.672   Anemia Panel: No results for input(s): VITAMINB12, FOLATE, FERRITIN, TIBC, IRON, RETICCTPCT in the last 72 hours. Sepsis Labs: No results for input(s): PROCALCITON, LATICACIDVEN in the last 168 hours.  No results found for this or any previous visit (from the past 240 hour(s)).       Radiology Studies: Dg Chest 2 View  Result Date: 07/16/2016 CLINICAL DATA:  Shortness of breath, chest pain, nausea and vomiting, and weakness, onset today. Hyperglycemia. EXAM: CHEST  2 VIEW COMPARISON:  None. FINDINGS: The heart size and mediastinal contours are within normal limits. Both lungs are clear. The visualized skeletal structures are unremarkable. IMPRESSION: No active cardiopulmonary disease. Electronically Signed   By: Burman NievesWilliam  Stevens M.D.   On: 07/16/2016 21:54        Scheduled Meds: . enoxaparin (LOVENOX) injection  40 mg Subcutaneous Q24H  . insulin aspart  0-15 Units Subcutaneous TID WC  . insulin aspart  0-5 Units Subcutaneous QHS  . insulin glargine  40 Units Subcutaneous Daily   Continuous Infusions: . sodium chloride 75 mL/hr at 07/18/16 0421  . insulin (NOVOLIN-R) infusion Stopped (07/18/16 0548)     LOS: 2 days    Time spent: 35 minutes.     Alba Coryegalado, Dyllan Hughett A, MD [facility] Pager [phone]  If 7PM-7AM, please contact night-coverage [email or web address] Password [email or web address] 07/18/2016, 5:06 PM

## 2016-07-18 NOTE — Care Management Note (Signed)
Case Management Note  Patient Details  Name: Justin [name] MRN: [id number] Date of Birth: 1996-06-08  Subjective/Objective: 20 y/o m admitted w/DKA. From home. Spoke to patient in rm about process with medicaid-pcp,meds,f/u-pcp listed-Dr. Vikki PortsValerie Keck,[facility], [place]-informed patient he must f/u w/medicaid case worker-provided him with DSS tel # from resource list, along w/medicaid accepting Dr's-but re-iterrated that he must go down to DSS in order for the Dr to be changed on medicaid card,& also informed that he must get re-certified if the case worker informs him to do that to be consistent with medicaid's process. He has a glucometer @ home, & lantus @ home.  Informed him of importance of following all of medicaid's requirements to have a pcp,pharmacy, & re-certification process.  There are no resources for med asst with medicaid-patient must follow govt process for pcp,meds.  Patient voiced understanding. I have also left vm for [employer] [place] access-coordinator for additional resources & f/u in community-await call [employer][name] Mckenzie.                   Action/Plan:d/c plan home.   Expected Discharge Date:                  Expected Discharge Plan:  Home/Self Care  In-House Referral:     Discharge planning Services  CM Consult  Post Acute Care Choice:    Choice offered to:     DME Arranged:    DME Agency:     HH Arranged:    HH Agency:     Status of Service:  In process, will continue to follow  If discussed at Long Length of Stay Meetings, dates discussed:    Additional Comments:  Lanier ClamMahabir, Naraya Stoneberg, RN 07/18/2016, 2:35 PM

## 2016-07-18 NOTE — Care Management Note (Signed)
Case Management Note  Patient Details  Name: Justin [name] MRN: [id number] Date of Birth: 1996-10-02  Subjective/Objective:  [facility] [place] [name]-patient's pcp-[facility] [place]-patient informed-voiced understanding.                  Action/Plan:d/c plan home.   Expected Discharge Date:                  Expected Discharge Plan:  Home/Self Care  In-House Referral:     Discharge planning Services  CM Consult  Post Acute Care Choice:    Choice offered to:     DME Arranged:    DME Agency:     HH Arranged:    HH Agency:     Status of Service:  In process, will continue to follow  If discussed at Long Length of Stay Meetings, dates discussed:    Additional Comments:  Lanier ClamMahabir, Kiffany Schelling, RN 07/18/2016, 3:53 PM

## 2016-07-19 LAB — BASIC METABOLIC PANEL
Anion gap: 8 (ref 5–15)
BUN: 13 mg/dL (ref 6–20)
CHLORIDE: 107 mmol/L (ref 101–111)
CO2: 24 mmol/L (ref 22–32)
CREATININE: 0.45 mg/dL — AB (ref 0.61–1.24)
Calcium: 8.7 mg/dL — ABNORMAL LOW (ref 8.9–10.3)
GFR calc Af Amer: >60 mL/min (ref >60)
GFR calc non Af Amer: >60 mL/min (ref >60)
GLUCOSE: 225 mg/dL — AB (ref 65–99)
Potassium: 3.1 mmol/L — ABNORMAL LOW (ref 3.5–5.1)
SODIUM: 139 mmol/L (ref 135–145)

## 2016-07-19 LAB — GLUCOSE, CAPILLARY
GLUCOSE-CAPILLARY: 280 mg/dL — AB (ref 65–99)
GLUCOSE-CAPILLARY: 335 mg/dL — AB (ref 65–99)
Glucose-Capillary: 196 mg/dL — ABNORMAL HIGH (ref 65–99)
Glucose-Capillary: 332 mg/dL — ABNORMAL HIGH (ref 65–99)

## 2016-07-19 MED ORDER — POTASSIUM CHLORIDE CRYS ER 20 MEQ PO TBCR
40.0000 meq | EXTENDED_RELEASE_TABLET | Freq: Once | ORAL | Status: AC
Start: 2016-07-19 — End: 2016-07-19
  Administered 2016-07-19: 40 meq via ORAL
  Filled 2016-07-19: qty 2

## 2016-07-19 MED ORDER — INSULIN ASPART 100 UNIT/ML ~~LOC~~ SOLN: 4.0000 [IU] | Freq: Three times a day (TID) | SUBCUTANEOUS | Status: DC

## 2016-07-19 MED ORDER — INSULIN GLARGINE 100 UNIT/ML SOLOSTAR PEN: 46.0000 [IU] | PEN_INJECTOR | Freq: Every day | SUBCUTANEOUS | 0 refills | Status: DC

## 2016-07-19 MED ORDER — INSULIN GLARGINE 100 UNIT/ML ~~LOC~~ SOLN: 50.0000 [IU] | Freq: Every day | SUBCUTANEOUS | Status: DC

## 2016-07-19 MED ORDER — INSULIN GLARGINE 100 UNIT/ML ~~LOC~~ SOLN
5.0000 [IU] | Freq: Once | SUBCUTANEOUS | Status: AC
  Administered 2016-07-19: 5 [IU] via SUBCUTANEOUS
  Filled 2016-07-19: qty 0.05

## 2016-07-19 MED ORDER — INSULIN GLARGINE 100 UNIT/ML ~~LOC~~ SOLN: 46.0000 [IU] | Freq: Every day | SUBCUTANEOUS | Status: DC

## 2016-07-19 MED ORDER — POTASSIUM CHLORIDE CRYS ER 20 MEQ PO TBCR: 40.0000 meq | EXTENDED_RELEASE_TABLET | Freq: Once | ORAL | 0 refills | Status: DC

## 2016-07-19 NOTE — Progress Notes (Addendum)
Inpatient Diabetes Program Recommendations  AACE/ADA: New Consensus Statement on Inpatient Glycemic Control (2015)  Target Ranges:  Prepandial:   less than 140 mg/dL      Peak postprandial:   less than 180 mg/dL (1-2 hours)      Critically ill patients:  140 - 180 mg/dL   Results for Justin [name], Justin [name] (MRN [id number]) as of 07/19/2016 08:36  Ref. Range 07/18/2016 07:40 07/18/2016 11:49 07/18/2016 16:47 07/18/2016 21:33  Glucose-Capillary Latest Ref Range: 65 - 99 mg/dL 782149 (H) 956192 (H) 213289 (H) 268 (H)   Results for Justin [name], Justin [name] (MRN [id number]) as of 07/19/2016 08:36  Ref. Range 07/19/2016 07:54  Glucose-Capillary Latest Ref Range: 65 - 99 mg/dL 629196 (H)    Home DM Meds: Lantus 50 units daily                             Novolog 5-15 units tidwc                             Novolog 1 unit for every 10 grams Carbohydrates  Current Insulin Orders: Lantus 40 units daily                                       Novolog Moderate Correction Scale/ SSI (0-15 units) TID AC + HS     MD- If patient not discharged home today for any reason, please consider the following in-hospital insulin adjustments:  1. Increase Lantus to 45 units daily   2. Start Novolog Meal Coverage: Novolog 6 units tid with meals (hold if pt eats <50% of meal)    Addendum 10am: Spoke with patient this AM.  Awake and eating breakfast at bedside table.  Patient stated he sees a doctor [facility] [place] [place].  [facility]ansico [facility]ren[facility] but [facility] out.  Stated he takes Lantus 50 units daily along with Novolog 1 unit for every 10 grams carbohydrates and also a correction factor dosage as well but could not be specific with the correction amount.  Knows how to count carbs but is inconsistent at home with this practice.  Did not have any questions for me regarding his diabetes care plan at home.      --Will follow patient during hospitalization--  Ambrose FinlandJeannine Johnston Dorcas Melito RN, MSN,  CDE Diabetes Coordinator Inpatient Glycemic Control Team Team Pager: [phone] (8a-5p)

## 2016-07-19 NOTE — Discharge Summary (Signed)
Physician Discharge Summary  Domingo DimesJairo Mullett ZOX:[id number] DOB: [birth year] DOA: 07/16/2016  PCP: Ambrose FinlandValerie A Keck, NP  Admit date: 07/16/2016 Discharge date: 07/19/2016  Admitted From: Home  Disposition:  Home   Recommendations for Outpatient Follow-up:  1. Follow up with PCP in 1-2 weeks 2. Please obtain BMP/CBC in one week 3. Please follow up on the following pending results:  Home Health: none Equipment/Devices; none  Discharge Condition: stable.  CODE STATUS:Full code.  Diet recommendation:  Carb Modified  Brief/Interim Summary: 1-DKA, DM; resolved, was treated with SSI.  Not compliant with medications. Couldn't get his insulin.  CM consulted.  On lantus 40 units. Increase to 46 units  SSI. meal coverage Discharge home later today.   Hyponatremia; resolved.   Hypokalemia oral supplement      Discharge Diagnoses:  Principal Problem:   DKA, type 1 [facility](HCC)    Discharge Instructions  Discharge Instructions    Diet Carb Modified    Complete by:  As directed   Increase activity slowly    Complete by:  As directed       Medication List    STOP taking these medications   traMADol 50 MG tablet Commonly known as:  ULTRAM     TAKE these medications   ACCU-CHEK AVIVA device Check sugars ACHS for E10.9   accu-chek soft touch lancets Check sugars ACHS for E10.9   aspirin EC 81 MG tablet Take 324 mg by mouth daily as needed (headaches).   glucose blood test strip Commonly known as:  ACCU-CHEK AVIVA Check sugars ACHS for E10.9   insulin aspart 100 UNIT/ML FlexPen Commonly known as:  NOVOLOG FLEXPEN INJECT 1-50 UNITS INTO THE SKIN DAILY. What changed:  how much to take  how to take this  when to take this  additional instructions   Insulin Glargine 100 UNIT/ML Solostar Pen Commonly known as:  LANTUS SOLOSTAR Inject 46 Units into the skin daily. UP TO 50 UNITS PER DAY AS DIRECTED BY MD What changed:  how much to take  how to take this  when to  take this   INSULIN SYRINGE 1CC/29G 29G X 1/2" 1 ML Misc Use for sliding scale coverage as directed.   potassium chloride SA 20 MEQ tablet Commonly known as:  K-DUR,KLOR-CON Take 2 tablets (40 mEq total) by mouth once.       No Known Allergies  Consultations:  none   Procedures/Studies: Dg Chest 2 View  Result Date: 07/16/2016 CLINICAL DATA:  Shortness of breath, chest pain, nausea and vomiting, and weakness, onset today. Hyperglycemia. EXAM: CHEST  2 VIEW COMPARISON:  None. FINDINGS: The heart size and mediastinal contours are within normal limits. Both lungs are clear. The visualized skeletal structures are unremarkable. IMPRESSION: No active cardiopulmonary disease. Electronically Signed   By: Burman NievesWilliam  Stevens M.D.   On: 07/16/2016 21:54     Subjective: Feeling well, no ne complaints   Discharge Exam: Vitals:   07/19/16 0519 07/19/16 1013  BP: (!) 107/52 114/64  Pulse: 62 66  Resp: 16 16  Temp: 97.7 F (36.5 C) 97.6 F (36.4 C)   Vitals:   07/18/16 1417 07/18/16 2135 07/19/16 0519 07/19/16 1013  BP: (!) 116/57 123/72 (!) 107/52 114/64  Pulse: 70 78 62 66  Resp: 18 18 16 16   Temp: 98 F (36.7 C) 98 F (36.7 C) 97.7 F (36.5 C) 97.6 F (36.4 C)  TempSrc: Oral Oral Oral Oral  SpO2: 98% 100% 100% 100%  Weight:  Height:        General: Pt is alert, awake, not in acute distress Cardiovascular: RRR, S1/S2 +, no rubs, no gallops Respiratory: CTA bilaterally, no wheezing, no rhonchi Abdominal: Soft, NT, ND, bowel sounds + Extremities: no edema, no cyanosis    The results of significant diagnostics from this hospitalization (including imaging, microbiology, ancillary and laboratory) are listed below for reference.     Microbiology: No results found for this or any previous visit (from the past 240 hour(s)).   Labs: BNP (last 3 results) No results for input(s): BNP in the last 8760 hours. Basic Metabolic Panel:  Recent Labs Lab 07/17/16 0554  07/17/16 0922 07/17/16 1309 07/18/16 0252 07/19/16 0537  NA 132* 133* 132* 135 139  K 4.6 4.1 4.7 3.0* 3.1*  CL 105 109 107 111 107  CO2 7* 11* 10* 20* 24  GLUCOSE 324* 191* 197* 125* 225*  BUN 16 16 15 19 13   CREATININE 1.03 0.95 0.90 0.60* 0.45*  CALCIUM 8.8* 8.6* 8.3* 8.1* 8.7*   Liver Function Tests: No results for input(s): AST, ALT, ALKPHOS, BILITOT, PROT, ALBUMIN in the last 168 hours. No results for input(s): LIPASE, AMYLASE in the last 168 hours. No results for input(s): AMMONIA in the last 168 hours. CBC:  Recent Labs Lab 07/16/16 2044  WBC 12.0*  HGB 14.7  HCT 44.0  MCV 81.9  PLT 354   Cardiac Enzymes:  Recent Labs Lab 07/17/16 0702  TROPONINI <0.03   BNP: Invalid input(s): POCBNP CBG:  Recent Labs Lab 07/18/16 0740 07/18/16 1149 07/18/16 1647 07/18/16 2133 07/19/16 0754  GLUCAP 149* 192* 289* 268* 196*   D-Dimer No results for input(s): DDIMER in the last 72 hours. Hgb A1c No results for input(s): HGBA1C in the last 72 hours. Lipid Profile No results for input(s): CHOL, HDL, LDLCALC, TRIG, CHOLHDL, LDLDIRECT in the last 72 hours. Thyroid function studies  Recent Labs  07/17/16 0702  TSH 0.672   Anemia work up No results for input(s): VITAMINB12, FOLATE, FERRITIN, TIBC, IRON, RETICCTPCT in the last 72 hours. Urinalysis    Component Value Date/Time   COLORURINE YELLOW 07/16/2016 2020   APPEARANCEUR CLEAR 07/16/2016 2020   LABSPEC 1.031 (H) 07/16/2016 2020   PHURINE 5.0 07/16/2016 2020   GLUCOSEU >1000 (A) 07/16/2016 2020   HGBUR NEGATIVE 07/16/2016 2020   BILIRUBINUR NEGATIVE 07/16/2016 2020   BILIRUBINUR neg 11/24/2015 1303   KETONESUR >80 (A) 07/16/2016 2020   PROTEINUR NEGATIVE 07/16/2016 2020   UROBILINOGEN 0.2 11/24/2015 1303   UROBILINOGEN 0.2 11/04/2012 1623   NITRITE NEGATIVE 07/16/2016 2020   LEUKOCYTESUR NEGATIVE 07/16/2016 2020   Sepsis Labs Invalid input(s): PROCALCITONIN,  WBC,  LACTICIDVEN Microbiology No  results found for this or any previous visit (from the past 240 hour(s)).   Time coordinating discharge: Over 30 minutes  SIGNED:   Alba Coryegalado, Leeza Heiner A, MD  [facility] 07/19/2016, 12:13 PM Pager [phone]  If 7PM-7AM, please contact night-coverage [email or web address] Password [email or web address]

## 2016-07-19 NOTE — Care Management Note (Signed)
Case Management Note  Patient Details  Name: Justin [name] MRN: [id number] Date of Birth: 24-Oct-1996  Subjective/Objective:                    Action/Plan:d/c home no needs or orders.   Expected Discharge Date:                  Expected Discharge Plan:  Home/Self Care  In-House Referral:     Discharge planning Services  CM Consult  Post Acute Care Choice:    Choice offered to:     DME Arranged:    DME Agency:     HH Arranged:    HH Agency:     Status of Service:  Completed, signed off  If discussed at [employer] Length of Stay Meetings, dates discussed:    Additional Comments:  Lanier ClamMahabir, Skarlette Lattner, RN 07/19/2016, 12:30 PM

## 2017-01-03 ENCOUNTER — Ambulatory Visit: Payer: Medicaid Other | Attending: Internal Medicine

## 2017-01-21 ENCOUNTER — Other Ambulatory Visit: Payer: Self-pay | Admitting: Pharmacist

## 2017-01-21 DIAGNOSIS — E109 Type 1 diabetes mellitus without complications: Secondary | ICD-10-CM

## 2017-01-21 MED ORDER — "INSULIN SYRINGE-NEEDLE U-100 31G X 5/16"" 1 ML MISC": 5 refills | Status: DC

## 2017-01-21 MED ORDER — INSULIN GLARGINE 100 UNIT/ML SOLOSTAR PEN: 46.0000 [IU] | PEN_INJECTOR | Freq: Every day | SUBCUTANEOUS | 0 refills | Status: DC

## 2017-01-21 MED ORDER — INSULIN ASPART 100 UNIT/ML FLEXPEN: PEN_INJECTOR | SUBCUTANEOUS | 0 refills | Status: DC

## 2017-01-21 MED FILL — BD INSULIN SYR 1 ML 6MMX31G: 31G X 15/64 | 30 days supply | Qty: 100 | Fill #0

## 2017-01-21 MED FILL — !NOVOLOG 100UNITS/ML VIAL: 100/ML | 28 days supply | Qty: 10 | Fill #0

## 2017-01-21 MED FILL — !LANTUS 100 UNITS/ML VIAL: 100 | 20 days supply | Qty: 10 | Fill #0

## 2017-01-21 NOTE — Telephone Encounter (Signed)
Patient reports to front desk of clinic to get insulin as he recently lost Medicaid and can longer afford his insulin from [facility]. Patient has not been seen since December 2016 - will order 1 week supply to allow patient time to get in to see new provider but to prevent admission since he is type 1 and must have insulin.

## 2017-02-04 ENCOUNTER — Other Ambulatory Visit: Payer: Self-pay | Admitting: *Deleted

## 2017-02-04 DIAGNOSIS — E109 Type 1 diabetes mellitus without complications: Secondary | ICD-10-CM

## 2017-02-04 MED ORDER — INSULIN ASPART 100 UNIT/ML FLEXPEN: PEN_INJECTOR | SUBCUTANEOUS | 3 refills | Status: DC

## 2017-02-04 MED ORDER — INSULIN GLARGINE 100 UNIT/ML SOLOSTAR PEN: 46.0000 [IU] | PEN_INJECTOR | Freq: Every day | SUBCUTANEOUS | 3 refills | Status: DC

## 2017-02-04 NOTE — Telephone Encounter (Signed)
PRINTED FOR PASS PROGRAM 

## 2017-02-18 ENCOUNTER — Other Ambulatory Visit: Payer: Self-pay | Admitting: *Deleted

## 2017-02-18 MED ORDER — INSULIN LISPRO 100 UNIT/ML (KWIKPEN): 5.0000 [IU] | PEN_INJECTOR | Freq: Three times a day (TID) | SUBCUTANEOUS | 3 refills | Status: DC

## 2017-02-18 NOTE — Telephone Encounter (Signed)
PRINTED FOR PASS PROGRAM 

## 2017-02-21 ENCOUNTER — Other Ambulatory Visit: Payer: Self-pay | Admitting: *Deleted

## 2017-02-21 MED ORDER — INSULIN LISPRO 100 UNIT/ML (KWIKPEN): 5.0000 [IU] | PEN_INJECTOR | Freq: Three times a day (TID) | SUBCUTANEOUS | 3 refills | Status: DC

## 2017-02-21 NOTE — Telephone Encounter (Signed)
PRINTED FOR PASS PROGRAM 

## 2017-03-05 ENCOUNTER — Other Ambulatory Visit: Payer: Self-pay | Admitting: Internal Medicine

## 2017-03-05 DIAGNOSIS — E109 Type 1 diabetes mellitus without complications: Secondary | ICD-10-CM

## 2017-03-05 MED FILL — !LANTUS 100 UNITS/ML VIAL: 100 | 20 days supply | Qty: 10 | Fill #1

## 2017-03-06 MED FILL — !NOVOLOG 100UNITS/ML VIAL: 100/ML | 22 days supply | Qty: 10 | Fill #0

## 2017-04-09 MED FILL — $LANTUS SOLOSTAR 100 UNITS/: 100 | 60 days supply | Qty: 30 | Fill #0

## 2017-04-09 MED FILL — $HUMALOG 100 UNITS/ML KWIKP: 100 | 30 days supply | Qty: 15 | Fill #0

## 2017-05-28 MED FILL — $HUMALOG 100 UNITS/ML KWIKP: 100 | 30 days supply | Qty: 15 | Fill #1

## 2017-07-15 MED FILL — $LANTUS SOLOSTAR 100 UNITS/: 100 | 60 days supply | Qty: 30 | Fill #1

## 2017-07-15 MED FILL — $HUMALOG 100 UNITS/ML KWIKP: 100 | 30 days supply | Qty: 15 | Fill #2

## 2017-08-26 ENCOUNTER — Telehealth: Payer: Self-pay | Admitting: Internal Medicine

## 2017-08-26 MED ORDER — "INSULIN SYRINGE-NEEDLE U-100 31G X 5/16"" 1 ML MISC": 0 refills | Status: DC

## 2017-08-26 NOTE — Telephone Encounter (Signed)
Patient came into office requesting refill on Insulin Syringe-Needle U-100 (TRUEPLUS INSULIN SYRINGE) 31G X 5/16" 1 ML MISC  Pt has been placed on wait list to est care, Please f/up

## 2017-09-17 MED FILL — $HUMALOG 100 UNITS/ML KWIKP: 100 | 30 days supply | Qty: 15 | Fill #0

## 2017-09-17 MED FILL — $LANTUS SOLOSTAR 100 UNITS/: 100 | 30 days supply | Qty: 15 | Fill #2

## 2017-11-04 MED FILL — $HUMALOG 100 UNITS/ML KWIKP: 100 | 30 days supply | Qty: 15 | Fill #1

## 2017-11-04 MED FILL — $LANTUS SOLOSTAR 100 UNITS/: 100 | 30 days supply | Qty: 15 | Fill #3

## 2018-01-15 MED FILL — $LANTUS SOLOSTAR 100 UNITS/: 100 | 30 days supply | Qty: 15 | Fill #4

## 2018-01-15 MED FILL — $HUMALOG 100 UNITS/ML KWIKP: 100 | 33 days supply | Qty: 15 | Fill #2

## 2018-04-22 ENCOUNTER — Emergency Department (HOSPITAL_COMMUNITY): Payer: Self-pay

## 2018-04-22 ENCOUNTER — Inpatient Hospital Stay (HOSPITAL_COMMUNITY)
Admission: EM | Admit: 2018-04-22 | Discharge: 2018-04-24 | DRG: 638 | Discharge status: Against Medical Advice | Payer: Self-pay | Attending: Internal Medicine | Admitting: Internal Medicine

## 2018-04-22 ENCOUNTER — Encounter (HOSPITAL_COMMUNITY): Payer: Self-pay | Admitting: Emergency Medicine

## 2018-04-22 DIAGNOSIS — E785 Hyperlipidemia, unspecified: Secondary | ICD-10-CM | POA: Diagnosis present

## 2018-04-22 DIAGNOSIS — Z794 Long term (current) use of insulin: Secondary | ICD-10-CM

## 2018-04-22 DIAGNOSIS — E876 Hypokalemia: Secondary | ICD-10-CM | POA: Diagnosis present

## 2018-04-22 DIAGNOSIS — E1110 Type 2 diabetes mellitus with ketoacidosis without coma: Secondary | ICD-10-CM | POA: Diagnosis present

## 2018-04-22 DIAGNOSIS — R0682 Tachypnea, not elsewhere classified: Secondary | ICD-10-CM | POA: Diagnosis present

## 2018-04-22 DIAGNOSIS — Z5321 Procedure and treatment not carried out due to patient leaving prior to being seen by health care provider: Secondary | ICD-10-CM | POA: Diagnosis present

## 2018-04-22 DIAGNOSIS — R079 Chest pain, unspecified: Secondary | ICD-10-CM

## 2018-04-22 DIAGNOSIS — Z9114 Patient's other noncompliance with medication regimen: Secondary | ICD-10-CM

## 2018-04-22 DIAGNOSIS — E1010 Type 1 diabetes mellitus with ketoacidosis without coma: Principal | ICD-10-CM | POA: Diagnosis present

## 2018-04-22 DIAGNOSIS — R000 Tachycardia, unspecified: Secondary | ICD-10-CM | POA: Diagnosis present

## 2018-04-22 DIAGNOSIS — T383X6A Underdosing of insulin and oral hypoglycemic [antidiabetic] drugs, initial encounter: Secondary | ICD-10-CM | POA: Diagnosis present

## 2018-04-22 DIAGNOSIS — X58XXXA Exposure to other specified factors, initial encounter: Secondary | ICD-10-CM | POA: Diagnosis present

## 2018-04-22 DIAGNOSIS — E871 Hypo-osmolality and hyponatremia: Secondary | ICD-10-CM | POA: Diagnosis present

## 2018-04-22 DIAGNOSIS — N179 Acute kidney failure, unspecified: Secondary | ICD-10-CM | POA: Diagnosis present

## 2018-04-22 DIAGNOSIS — F329 Major depressive disorder, single episode, unspecified: Secondary | ICD-10-CM | POA: Diagnosis present

## 2018-04-22 LAB — URINALYSIS, ROUTINE W REFLEX MICROSCOPIC
BACTERIA UA: NONE SEEN
Bilirubin Urine: NEGATIVE
Glucose, UA: >=500 mg/dL — AB
HGB URINE DIPSTICK: NEGATIVE
Ketones, ur: 80 mg/dL — AB
LEUKOCYTES UA: NEGATIVE
NITRITE: NEGATIVE
PROTEIN: NEGATIVE mg/dL
SPECIFIC GRAVITY, URINE: 1.028 (ref 1.005–1.030)
pH: 5 (ref 5.0–8.0)

## 2018-04-22 LAB — BASIC METABOLIC PANEL
ANION GAP: 27 — AB (ref 5–15)
BUN: 18 mg/dL (ref 6–20)
CHLORIDE: 95 mmol/L — AB (ref 101–111)
CO2: 10 mmol/L — ABNORMAL LOW (ref 22–32)
Calcium: 9.2 mg/dL (ref 8.9–10.3)
Creatinine, Ser: 1.46 mg/dL — ABNORMAL HIGH (ref 0.61–1.24)
GFR calc Af Amer: >60 mL/min (ref >60)
Glucose, Bld: 509 mg/dL (ref 65–99)
POTASSIUM: 4.8 mmol/L (ref 3.5–5.1)
SODIUM: 132 mmol/L — AB (ref 135–145)

## 2018-04-22 LAB — CBC
HEMATOCRIT: 46 % (ref 39.0–52.0)
HEMOGLOBIN: 14.6 g/dL (ref 13.0–17.0)
MCH: 27.4 pg (ref 26.0–34.0)
MCHC: 31.7 g/dL (ref 30.0–36.0)
MCV: 86.3 fL (ref 78.0–100.0)
Platelets: 327 10*3/uL (ref 150–400)
RBC: 5.33 MIL/uL (ref 4.22–5.81)
RDW: 13.9 % (ref 11.5–15.5)
WBC: 9.3 10*3/uL (ref 4.0–10.5)

## 2018-04-22 LAB — I-STAT CHEM 8, ED
BUN: 18 mg/dL (ref 6–20)
CREATININE: 0.7 mg/dL (ref 0.61–1.24)
Calcium, Ion: 1.15 mmol/L (ref 1.15–1.40)
Chloride: 101 mmol/L (ref 101–111)
GLUCOSE: 503 mg/dL — AB (ref 65–99)
HCT: 46 % (ref 39.0–52.0)
Hemoglobin: 15.6 g/dL (ref 13.0–17.0)
POTASSIUM: 4.6 mmol/L (ref 3.5–5.1)
Sodium: 134 mmol/L — ABNORMAL LOW (ref 135–145)
TCO2: 13 mmol/L — ABNORMAL LOW (ref 22–32)

## 2018-04-22 LAB — CBG MONITORING, ED: GLUCOSE-CAPILLARY: 509 mg/dL — AB (ref 65–99)

## 2018-04-22 MED ORDER — SODIUM CHLORIDE 0.9 % IV BOLUS
1000.0000 mL | Freq: Once | INTRAVENOUS | Status: AC
  Administered 2018-04-22: 1000 mL via INTRAVENOUS

## 2018-04-22 MED ORDER — DEXTROSE-NACL 5-0.45 % IV SOLN: INTRAVENOUS | Status: DC

## 2018-04-22 MED ORDER — SODIUM CHLORIDE 0.9 % IV SOLN
INTRAVENOUS | Status: DC
  Administered 2018-04-23: 3.7 [IU]/h via INTRAVENOUS
  Filled 2018-04-22: qty 1

## 2018-04-22 MED ORDER — POTASSIUM CHLORIDE 10 MEQ/100ML IV SOLN
10.0000 meq | INTRAVENOUS | Status: DC
  Administered 2018-04-22: 10 meq via INTRAVENOUS
  Filled 2018-04-22: qty 100

## 2018-04-22 MED ORDER — SODIUM CHLORIDE 0.9 % IV SOLN: INTRAVENOUS | Status: DC

## 2018-04-22 MED ORDER — SODIUM CHLORIDE 0.9 % IV BOLUS
1000.0000 mL | Freq: Once | INTRAVENOUS | Status: AC
  Administered 2018-04-23: 1000 mL via INTRAVENOUS

## 2018-04-22 NOTE — ED Provider Notes (Signed)
[facility] EMERGENCY DEPARTMENT Provider Note  CSN: [id number] Arrival date & time: 04/22/18 2108  Chief Complaint(s) Hyperglycemia  HPI Justin [name] is a 22 y.o. male   The history is provided by the patient.  Hyperglycemia  Blood sugar level PTA:  >500 Severity:  Moderate Onset quality:  Gradual Duration:  3 days Timing:  Constant Progression:  Waxing and waning Chronicity:  Chronic Diabetes status:  Controlled with insulin Current diabetic therapy:  Novolog Context comment:  Ran out of insulin 3 days ago due not being able to follow up with  Carl and wellness Relieved by:  Nothing Ineffective treatments:  None tried Associated symptoms: abdominal pain (generalized), fatigue, increased thirst, nausea, polyuria and vomiting (NBNB)   Associated symptoms: no confusion, no dysuria, no fever and no increased appetite     Past Medical History Past Medical History:  Diagnosis Date  . Allergy    allergic rhinitis  . Asthma    as a child, hospitalized at age 82  . Depression   . Diabetes mellitus without complication (HCC)   . Obesity    Patient Active Problem List   Diagnosis Date Noted  . DKA, type 1 (HCC) 07/16/2016  . Type 1 diabetes mellitus, uncontrolled (HCC) 11/13/2012  . Noncompliance with diabetes treatment 11/04/2012  . Hyperglycemia 11/04/2012  . Depression 10/27/2012  . 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 Diabetic ketoacidosis without coma associated with type 1 diabetes mellitus (HCC) 09/26/2012  . Adjustment reaction 09/26/2012  . Goiter 09/26/2012  . Type 1 diabetes mellitus on insulin therapy (HCC) 08/05/2012  . ASTHMA 09/02/2007   Home Medication(s) Prior to Admission medications   Medication Sig Start Date End Date Taking? Authorizing Provider  insulin aspart (NOVOLOG FLEXPEN) 100 UNIT/ML FlexPen INJECT 5-15 units into skin 3 times daily. Increase by 1 unit per 10 carbs. 02/04/17  Yes Quentin Angst, MD  Insulin  Glargine (LANTUS SOLOSTAR) 100 UNIT/ML Solostar Pen Inject 46 Units into the skin daily. UP TO 50 UNITS PER DAY AS DIRECTED BY MD Patient taking differently: Inject 35 Units into the skin daily.  02/04/17  Yes Jeanann Lewandowsky E, MD  glucose blood (ACCU-CHEK AVIVA) test strip Check sugars ACHS for E10.9 11/24/15   Holland Commons A, NP  insulin aspart (NOVOLOG) 100 UNIT/ML FlexPen INJECT 5-15 units into skin 3 times daily. Increase by 1 unit per 10 carbs. 03/06/17   Quentin Angst, MD  insulin lispro (HUMALOG KWIKPEN) 100 UNIT/ML KiwkPen Inject 0.05-0.15 mLs (5-15 Units total) into the skin 3 (three) times daily. INCREASE BY 1 UNIT PER 10 CARBS 02/21/17   Jeanann Lewandowsky E, MD  Insulin Syringe-Needle U-100 (TRUEPLUS INSULIN SYRINGE) 31G X 5/16" 1 ML MISC Use as directed to inject insulin 08/26/17   Quentin Angst, MD  Lancets (ACCU-CHEK SOFT TOUCH) lancets Check sugars ACHS for E10.9 11/24/15   Holland Commons A, NP  potassium chloride SA (K-DUR,KLOR-CON) 20 MEQ tablet Take 2 tablets (40 mEq total) by mouth once. 07/19/16 07/19/16  Regalado, Prentiss Bells, MD  Past Surgical History Past Surgical History:  Procedure Laterality Date  . CIRCUMCISION     Family History Family History  Problem Relation Age of Onset  . Cancer Neg Hx   . Diabetes Neg Hx   . Heart failure Neg Hx   . Hyperlipidemia Neg Hx   . Thyroid disease Neg Hx   . Stroke Neg Hx     Social History Social History   Tobacco Use  . Smoking status: Never Smoker  . Smokeless tobacco: Never Used  Substance Use Topics  . Alcohol use: No  . Drug use: Yes    Types: Marijuana   Allergies Patient has no known allergies.  Review of Systems Review of Systems  Constitutional: Positive for fatigue. Negative for fever.  Gastrointestinal: Positive for abdominal pain (generalized), nausea and vomiting  (NBNB).  Endocrine: Positive for polydipsia and polyuria.  Genitourinary: Negative for dysuria.  Psychiatric/Behavioral: Negative for confusion.   All other systems are reviewed and are negative for acute change except as noted in the HPI  Physical Exam Vital Signs  I have reviewed the triage vital signs BP (!) 111/55 (BP Location: Right Arm)   Pulse (!) 103   Temp 98.4 F (36.9 C) (Oral)   Resp 20   Ht  (1.702 m)   Wt 83.5 kg (184 lb)   SpO2 100%   BMI 28.82 kg/m   Physical Exam  Constitutional: He is oriented to person, place, and time. He appears well-developed and well-nourished. No distress.  HENT:  Head: Normocephalic and atraumatic.  Right Ear: External ear normal.  Left Ear: External ear normal.  Nose: Nose normal.  Mouth/Throat: Mucous membranes are normal. No trismus in the jaw.  Eyes: Conjunctivae and EOM are normal. No scleral icterus.  Neck: Normal range of motion and phonation normal.  Cardiovascular: Regular rhythm. Tachycardia present.  Pulmonary/Chest: Effort normal. No stridor. No respiratory distress.  Abdominal: He exhibits no distension. There is tenderness in the epigastric area. There is no rigidity, no rebound, no guarding and no CVA tenderness.  Musculoskeletal: Normal range of motion. He exhibits no edema.  Neurological: He is alert and oriented to person, place, and time.  Skin: He is not diaphoretic.  Psychiatric: He has a normal mood and affect. His behavior is normal.  Vitals reviewed.   ED Results and Treatments Labs (all labs ordered are listed, but only abnormal results are displayed) Labs Reviewed  BASIC METABOLIC PANEL - Abnormal; Notable for the following components:      Result Value   Sodium 132 (*)    Chloride 95 (*)    CO2 10 (*)    Glucose, Bld 509 (*)    Creatinine, Ser 1.46 (*)    Anion gap 27 (*)    All other components within normal limits  URINALYSIS, ROUTINE W REFLEX MICROSCOPIC - Abnormal; Notable for the  following components:   Color, Urine STRAW (*)    Glucose, UA >=500 (*)    Ketones, ur 80 (*)    All other components within normal limits  CBG MONITORING, ED - Abnormal; Notable for the following components:   Glucose-Capillary 509 (*)    All other components within normal limits  CBG MONITORING, ED - Abnormal; Notable for the following components:   Glucose-Capillary 430 (*)    All other components within normal limits  I-STAT CHEM 8, ED - Abnormal; Notable for the following components:   Sodium 134 (*)    Glucose, Bld 503 (*)  TCO2 13 (*)    All other components within normal limits  CBC  I-STAT VENOUS BLOOD GAS, ED                                                                                                                         EKG  EKG Interpretation  Date/Time:    Ventricular Rate:    PR Interval:    QRS Duration:   QT Interval:    QTC Calculation:   R Axis:     Text Interpretation:        Radiology Dg Chest Port 1 View  Result Date: 04/23/2018 CLINICAL DATA:  Short of breath EXAM: PORTABLE CHEST 1 VIEW COMPARISON:  07/16/2016 FINDINGS: The heart size and mediastinal contours are within normal limits. Both lungs are clear. The visualized skeletal structures are unremarkable. IMPRESSION: No active disease. Electronically Signed   By: Jasmine Pang M.D.   On: 04/23/2018 00:08   Pertinent labs & imaging results that were available during my care of the patient were reviewed by me and considered in my medical decision making (see chart for details).  Medications Ordered in ED Medications  sodium chloride 0.9 % bolus 1,000 mL (1,000 mLs Intravenous New Bag/Given 04/22/18 2239)    And  sodium chloride 0.9 % bolus 1,000 mL (has no administration in time range)    And  0.9 %  sodium chloride infusion (has no administration in time range)  insulin regular (NOVOLIN R,HUMULIN R) 100 Units in sodium chloride 0.9 % 100 mL (1 Units/mL) infusion (3.7 Units/hr Intravenous  New Bag/Given 04/23/18 0016)  potassium chloride 10 mEq in 100 mL IVPB (10 mEq Intravenous New Bag/Given 04/22/18 2322)  dextrose 5 %-0.45 % sodium chloride infusion (has no administration in time range)                                                                                                                                    Procedures Procedures CRITICAL CARE Performed by: Amadeo Garnet Cardama Total critical care time: 30 minutes Critical care time was exclusive of separately billable procedures and treating other patients. Critical care was necessary to treat or prevent imminent or life-threatening deterioration. Critical care was time spent personally by me on the following activities: development of treatment plan with patient and/or surrogate as well as nursing, discussions with consultants, evaluation of patient's response to treatment, examination of patient,  obtaining history from patient or surrogate, ordering and performing treatments and interventions, ordering and review of laboratory studies, ordering and review of radiographic studies, pulse oximetry and re-evaluation of patient's condition.   (including critical care time)  Medical Decision Making / ED Course I have reviewed the nursing notes for this encounter and the patient's prior records (if available in EHR or on provided paperwork).    Presentation consistent with diabetic ketoacidosis.  Started on aggressive IV fluid hydration and insulin drip.  Case discussed with medicine for admission and continued management.  Final Clinical Impression(s) / ED Diagnoses Final diagnoses:  Diabetic ketoacidosis without coma associated with type 1 diabetes mellitus (HCC)      This chart was dictated using voice recognition software.  Despite best efforts to proofread,  errors can occur which can change the documentation meaning.   Nira Conn, MD 04/23/18 [phone]

## 2018-04-22 NOTE — ED Triage Notes (Signed)
Pt presents with CP, decreased appetite, kussmals resp, polyuria; pt states he ran out of insulin 3 days ago and has not checked his sugar

## 2018-04-23 ENCOUNTER — Other Ambulatory Visit: Payer: Self-pay

## 2018-04-23 ENCOUNTER — Encounter (HOSPITAL_COMMUNITY): Payer: Self-pay

## 2018-04-23 DIAGNOSIS — E1010 Type 1 diabetes mellitus with ketoacidosis without coma: Principal | ICD-10-CM

## 2018-04-23 DIAGNOSIS — N179 Acute kidney failure, unspecified: Secondary | ICD-10-CM

## 2018-04-23 DIAGNOSIS — E1110 Type 2 diabetes mellitus with ketoacidosis without coma: Secondary | ICD-10-CM | POA: Diagnosis present

## 2018-04-23 LAB — CBG MONITORING, ED
GLUCOSE-CAPILLARY: 473 mg/dL — AB (ref 65–99)
GLUCOSE-CAPILLARY: 328 mg/dL — AB (ref 65–99)
GLUCOSE-CAPILLARY: 242 mg/dL — AB (ref 65–99)
GLUCOSE-CAPILLARY: 196 mg/dL — AB (ref 65–99)
GLUCOSE-CAPILLARY: 167 mg/dL — AB (ref 65–99)
GLUCOSE-CAPILLARY: 281 mg/dL — AB (ref 65–99)
Glucose-Capillary: 175 mg/dL — ABNORMAL HIGH (ref 65–99)
Glucose-Capillary: 176 mg/dL — ABNORMAL HIGH (ref 65–99)
Glucose-Capillary: 177 mg/dL — ABNORMAL HIGH (ref 65–99)
Glucose-Capillary: 187 mg/dL — ABNORMAL HIGH (ref 65–99)
Glucose-Capillary: 197 mg/dL — ABNORMAL HIGH (ref 65–99)
Glucose-Capillary: 197 mg/dL — ABNORMAL HIGH (ref 65–99)
Glucose-Capillary: 299 mg/dL — ABNORMAL HIGH (ref 65–99)
Glucose-Capillary: 388 mg/dL — ABNORMAL HIGH (ref 65–99)
Glucose-Capillary: 430 mg/dL — ABNORMAL HIGH (ref 65–99)

## 2018-04-23 LAB — RAPID URINE DRUG SCREEN, HOSP PERFORMED
Amphetamines: NOT DETECTED
BARBITURATES: NOT DETECTED
Benzodiazepines: NOT DETECTED
Cocaine: NOT DETECTED
OPIATES: NOT DETECTED
Tetrahydrocannabinol: NOT DETECTED

## 2018-04-23 LAB — BASIC METABOLIC PANEL
Anion gap: 20 — ABNORMAL HIGH (ref 5–15)
Anion gap: 11 (ref 5–15)
Anion gap: 7 (ref 5–15)
BUN: 20 mg/dL (ref 6–20)
BUN: 16 mg/dL (ref 6–20)
BUN: 15 mg/dL (ref 6–20)
BUN: 15 mg/dL (ref 6–20)
CHLORIDE: 102 mmol/L (ref 101–111)
CHLORIDE: 111 mmol/L (ref 101–111)
CHLORIDE: 111 mmol/L (ref 101–111)
CHLORIDE: 110 mmol/L (ref 101–111)
CO2: <7 mmol/L — ABNORMAL LOW (ref 22–32)
CO2: 9 mmol/L — AB (ref 22–32)
CO2: 16 mmol/L — AB (ref 22–32)
CO2: 21 mmol/L — AB (ref 22–32)
CREATININE: 1.44 mg/dL — AB (ref 0.61–1.24)
CREATININE: 1.11 mg/dL (ref 0.61–1.24)
CREATININE: 0.83 mg/dL (ref 0.61–1.24)
Calcium: 9 mg/dL (ref 8.9–10.3)
Calcium: 8.1 mg/dL — ABNORMAL LOW (ref 8.9–10.3)
Calcium: 8.1 mg/dL — ABNORMAL LOW (ref 8.9–10.3)
Calcium: 8.3 mg/dL — ABNORMAL LOW (ref 8.9–10.3)
Creatinine, Ser: 1.6 mg/dL — ABNORMAL HIGH (ref 0.61–1.24)
GFR calc Af Amer: >60 mL/min (ref >60)
GFR calc Af Amer: >60 mL/min (ref >60)
GFR calc non Af Amer: 60 mL/min — ABNORMAL LOW (ref >60)
GFR calc non Af Amer: >60 mL/min (ref >60)
GFR calc non Af Amer: >60 mL/min (ref >60)
GFR calc non Af Amer: >60 mL/min (ref >60)
Glucose, Bld: 465 mg/dL — ABNORMAL HIGH (ref 65–99)
Glucose, Bld: 206 mg/dL — ABNORMAL HIGH (ref 65–99)
Glucose, Bld: 164 mg/dL — ABNORMAL HIGH (ref 65–99)
Glucose, Bld: 181 mg/dL — ABNORMAL HIGH (ref 65–99)
POTASSIUM: 5.8 mmol/L — AB (ref 3.5–5.1)
POTASSIUM: 4.6 mmol/L (ref 3.5–5.1)
POTASSIUM: 3.6 mmol/L (ref 3.5–5.1)
POTASSIUM: 3.3 mmol/L — AB (ref 3.5–5.1)
SODIUM: 137 mmol/L (ref 135–145)
Sodium: 140 mmol/L (ref 135–145)
Sodium: 138 mmol/L (ref 135–145)
Sodium: 138 mmol/L (ref 135–145)

## 2018-04-23 LAB — LIPID PANEL
Cholesterol: 191 mg/dL (ref 0–200)
HDL: 36 mg/dL — AB (ref >40)
LDL CALC: 139 mg/dL — AB (ref 0–99)
Total CHOL/HDL Ratio: 5.3 RATIO
Triglycerides: 82 mg/dL (ref <150)
VLDL: 16 mg/dL (ref 0–40)

## 2018-04-23 LAB — GLUCOSE, CAPILLARY
GLUCOSE-CAPILLARY: 169 mg/dL — AB (ref 65–99)
GLUCOSE-CAPILLARY: 261 mg/dL — AB (ref 65–99)
GLUCOSE-CAPILLARY: 88 mg/dL (ref 65–99)
Glucose-Capillary: 135 mg/dL — ABNORMAL HIGH (ref 65–99)

## 2018-04-23 LAB — HEMOGLOBIN A1C
HEMOGLOBIN A1C: 12.5 % — AB (ref 4.8–5.6)
MEAN PLASMA GLUCOSE: 312.05 mg/dL

## 2018-04-23 LAB — HIV ANTIBODY (ROUTINE TESTING W REFLEX): HIV Screen 4th Generation wRfx: NONREACTIVE

## 2018-04-23 MED ORDER — POTASSIUM CHLORIDE 10 MEQ/100ML IV SOLN
10.0000 meq | Freq: Once | INTRAVENOUS | Status: AC
  Administered 2018-04-23: 10 meq via INTRAVENOUS
  Filled 2018-04-23: qty 100

## 2018-04-23 MED ORDER — DEXTROSE-NACL 5-0.45 % IV SOLN
INTRAVENOUS | Status: DC
  Administered 2018-04-23: 01:00:00 via INTRAVENOUS

## 2018-04-23 MED ORDER — ENOXAPARIN SODIUM 40 MG/0.4ML ~~LOC~~ SOLN
40.0000 mg | SUBCUTANEOUS | Status: DC
  Administered 2018-04-23 – 2018-04-24 (×2): 40 mg via SUBCUTANEOUS
  Filled 2018-04-23 (×2): qty 0.4

## 2018-04-23 MED ORDER — PROMETHAZINE HCL 25 MG/ML IJ SOLN: 25.0000 mg | Freq: Four times a day (QID) | INTRAMUSCULAR | Status: DC | PRN

## 2018-04-23 MED ORDER — INSULIN GLARGINE 100 UNIT/ML ~~LOC~~ SOLN
35.0000 [IU] | Freq: Every day | SUBCUTANEOUS | Status: DC
  Administered 2018-04-23: 35 [IU] via SUBCUTANEOUS
  Filled 2018-04-23 (×3): qty 0.35

## 2018-04-23 MED ORDER — SODIUM CHLORIDE 0.9 % IV SOLN
INTRAVENOUS | Status: DC
  Administered 2018-04-23: 02:00:00 via INTRAVENOUS

## 2018-04-23 MED ORDER — ONDANSETRON HCL 4 MG/2ML IJ SOLN
4.0000 mg | Freq: Four times a day (QID) | INTRAMUSCULAR | Status: DC | PRN
  Administered 2018-04-23: 4 mg via INTRAVENOUS
  Filled 2018-04-23: qty 2

## 2018-04-23 MED ORDER — INSULIN ASPART 100 UNIT/ML ~~LOC~~ SOLN
0.0000 [IU] | SUBCUTANEOUS | Status: DC
  Administered 2018-04-23 – 2018-04-24 (×2): 3 [IU] via SUBCUTANEOUS
  Administered 2018-04-24: 8 [IU] via SUBCUTANEOUS
  Administered 2018-04-24: 2 [IU] via SUBCUTANEOUS
  Administered 2018-04-24: 5 [IU] via SUBCUTANEOUS
  Administered 2018-04-24: 8 [IU] via SUBCUTANEOUS

## 2018-04-23 MED ORDER — SODIUM CHLORIDE 0.9 % IV SOLN
INTRAVENOUS | Status: DC
  Filled 2018-04-23 (×2): qty 1

## 2018-04-23 NOTE — Progress Notes (Signed)
Called Dr. Joseph Art about lab work, Insulin infusion, progression orders and he stated he would place orders. Alert and oriented, steady gait, will continue to monitor.

## 2018-04-23 NOTE — Progress Notes (Signed)
Inpatient Diabetes Program Recommendations  AACE/ADA: New Consensus Statement on Inpatient Glycemic Control (2015)  Target Ranges:  Prepandial:   less than 140 mg/dL      Peak postprandial:   less than 180 mg/dL (1-2 hours)      Critically ill patients:  140 - 180 mg/dL   Lab Results  Component Value Date   GLUCAP 135 (H) 04/23/2018   HGBA1C 12.10 11/24/2015    Review of Glycemic Control  Diabetes history: DM1 Outpatient Diabetes medications: Lantus 35 units QD, Novolog 0-15 units tidwc and 1:10 CHO ratio Current orders for Inpatient glycemic control: IV insulin drip  Inpatient Diabetes Program Recommendations:     When ready to transition to SQ insulin,  Lantus 35 units Q24H Novolog 0-15 units Q4H x 12H, then tidwc and hs When eating CHO mod diet, add Novolog 4 units tidwc. Need updated HgbA1C to assess glycemic control PTA  Will speak with pt regarding his diabetes control in am.  Follow.  Thank you. Ailene Ards, RD, LDN, CDE Inpatient Diabetes Coordinator [phone]

## 2018-04-23 NOTE — H&P (Signed)
History and Physical    Justin [name] WUJ:[id number] DOB: 01/13/1996 DOA: 04/22/2018  PCP: Quentin Angst, MD   Patient coming from: home  Chief Complaint: Malaise, abd discomfort, N/V, ran out of insulin   HPI: Justin [name] is a 22 y.o. male with medical history significant for insulin-dependent diabetes mellitus, now presentin to the emergency department for evaluation of general malaise with abdominal discomfort, nausea, and nonbloody vomiting after running out of insulin 3 days ago.  Patient reports that he ran out of insulin approximately 3 days ago, continued to be in his usual state until this morning when he developed a general malaise with diffuse abdominal discomfort, nausea, and nonbloody emesis.  He denies recent fevers or chills and denies cough or diarrhea.  ED Course: Upon arrival to the ED, patient is found to be afebrile, saturating well on room air, slightly tachypneic and tachycardic, and with stable blood pressure.  Chest x-ray is negative for acute cardiopulmonary disease.  Chemistry panel is notable for slight hyponatremia, bicarbonate of 10, anion gap 27, glucose 509, and creatinine of 1.46, up from an apparent baseline of roughly 0.5.  CBC is unremarkable and urinalysis is notable for glucosuria and ketonuria.  Patient was treated with 2 L normal saline, 10 mEq of IV potassium, and started on insulin infusion in the ED.  Tachycardia resolved, blood pressure remained stable, and the patient will be admitted for ongoing evaluation and management of DKA in the setting of running out of insulin.  Review of Systems:  All other systems reviewed and apart from HPI, are negative.  Past Medical History:  Diagnosis Date  . Allergy    allergic rhinitis  . Asthma    as a child, hospitalized at age 36  . Depression   . Diabetes mellitus without complication (HCC)   . Obesity     Past Surgical History:  Procedure Laterality Date  . CIRCUMCISION       reports that he  has never smoked. He has never used smokeless tobacco. He reports that he has current or past drug history. Drug: Marijuana. He reports that he does not drink alcohol.  No Known Allergies  Family History  Problem Relation Age of Onset  . Cancer Neg Hx   . Diabetes Neg Hx   . Heart failure Neg Hx   . Hyperlipidemia Neg Hx   . Thyroid disease Neg Hx   . Stroke Neg Hx      Prior to Admission medications   Medication Sig Start Date End Date Taking? Authorizing Provider  insulin aspart (NOVOLOG FLEXPEN) 100 UNIT/ML FlexPen INJECT 5-15 units into skin 3 times daily. Increase by 1 unit per 10 carbs. 02/04/17  Yes Quentin Angst, MD  Insulin Glargine (LANTUS SOLOSTAR) 100 UNIT/ML Solostar Pen Inject 46 Units into the skin daily. UP TO 50 UNITS PER DAY AS DIRECTED BY MD Patient taking differently: Inject 35 Units into the skin daily.  02/04/17  Yes Quentin Angst, MD  glucose blood (ACCU-CHEK AVIVA) test strip Check sugars ACHS for E10.9 11/24/15   Holland Commons A, NP  Insulin Syringe-Needle U-100 (TRUEPLUS INSULIN SYRINGE) 31G X 5/16" 1 ML MISC Use as directed to inject insulin 08/26/17   Quentin Angst, MD  Lancets (ACCU-CHEK SOFT TOUCH) lancets Check sugars ACHS for E10.9 11/24/15   Holland Commons A, NP  potassium chloride SA (K-DUR,KLOR-CON) 20 MEQ tablet Take 2 tablets (40 mEq total) by mouth once. 07/19/16 07/19/16  Regalado, Prentiss Bells, MD  Physical Exam: Vitals:   04/22/18 2115 04/22/18 2239 04/22/18 2241 04/22/18 2245  BP: (!) 111/55 124/64  (!) 116/56  Pulse: (!) 103 94  87  Resp: 20 (!) 25  19  Temp: 98.4 F (36.9 C)     TempSrc: Oral     SpO2: 100% 100% 100% 100%  Weight: 83.5 kg (184 lb)     Height:  (1.702 m)         Constitutional: No acute respiratory distress, in apparent discomfort  Eyes: PERTLA, lids and conjunctivae normal ENMT: Mucous membranes are moist. Posterior pharynx clear of any exudate or lesions.   Neck: normal, supple, no masses, no  thyromegaly Respiratory: clear to auscultation bilaterally, no wheezing, no crackles. Mild tachypnea.  Cardiovascular: S1 & S2 heard, regular rate and rhythm. No extremity edema.   Abdomen: No distension, soft, diffusely tender without rebound pain or guarding. Bowel sounds active.  Musculoskeletal: no clubbing / cyanosis. No joint deformity upper and lower extremities.   Skin: no significant rashes, lesions, ulcers. Warm, dry, well-perfused. Neurologic: CN 2-12 grossly intact. Sensation intact. Strength 5/5 in all 4 limbs.  Psychiatric: Alert and oriented to person, place, and situation. Calm, cooperative.     Labs on Admission: I have personally reviewed following labs and imaging studies  CBC: Recent Labs  Lab 04/22/18 2135 04/22/18 2237  WBC 9.3  --   HGB 14.6 15.6  HCT 46.0 46.0  MCV 86.3  --   PLT 327  --    Basic Metabolic Panel: Recent Labs  Lab 04/22/18 2135 04/22/18 2237  NA 132* 134*  K 4.8 4.6  CL 95* 101  CO2 10*  --   GLUCOSE 509* 503*  BUN 18 18  CREATININE 1.46* 0.70  CALCIUM 9.2  --    GFR: Estimated Creatinine Clearance: 149.8 mL/min (by C-G formula based on SCr of 0.7 mg/dL). Liver Function Tests: No results for input(s): AST, ALT, ALKPHOS, BILITOT, PROT, ALBUMIN in the last 168 hours. No results for input(s): LIPASE, AMYLASE in the last 168 hours. No results for input(s): AMMONIA in the last 168 hours. Coagulation Profile: No results for input(s): INR, PROTIME in the last 168 hours. Cardiac Enzymes: No results for input(s): CKTOTAL, CKMB, CKMBINDEX, TROPONINI in the last 168 hours. BNP (last 3 results) No results for input(s): PROBNP in the last 8760 hours. HbA1C: No results for input(s): HGBA1C in the last 72 hours. CBG: Recent Labs  Lab 04/22/18 2111 04/23/18 0012  GLUCAP 509* 430*   Lipid Profile: No results for input(s): CHOL, HDL, LDLCALC, TRIG, CHOLHDL, LDLDIRECT in the last 72 hours. Thyroid Function Tests: No results for  input(s): TSH, T4TOTAL, FREET4, T3FREE, THYROIDAB in the last 72 hours. Anemia Panel: No results for input(s): VITAMINB12, FOLATE, FERRITIN, TIBC, IRON, RETICCTPCT in the last 72 hours. Urine analysis:    Component Value Date/Time   COLORURINE STRAW (A) 04/22/2018 2121   APPEARANCEUR CLEAR 04/22/2018 2121   LABSPEC 1.028 04/22/2018 2121   PHURINE 5.0 04/22/2018 2121   GLUCOSEU >=500 (A) 04/22/2018 2121   HGBUR NEGATIVE 04/22/2018 2121   BILIRUBINUR NEGATIVE 04/22/2018 2121   BILIRUBINUR neg 11/24/2015 1303   KETONESUR 80 (A) 04/22/2018 2121   PROTEINUR NEGATIVE 04/22/2018 2121   UROBILINOGEN 0.2 11/24/2015 1303   UROBILINOGEN 0.2 11/04/2012 1623   NITRITE NEGATIVE 04/22/2018 2121   LEUKOCYTESUR NEGATIVE 04/22/2018 2121   Sepsis Labs: (procalcitonin:4,lacticidven:4) )No results found for this or any previous visit (from the past 240 hour(s)).   Radiological  Exams on Admission: Dg Chest Port 1 View  Result Date: 04/23/2018 CLINICAL DATA:  Short of breath EXAM: PORTABLE CHEST 1 VIEW COMPARISON:  07/16/2016 FINDINGS: The heart size and mediastinal contours are within normal limits. Both lungs are clear. The visualized skeletal structures are unremarkable. IMPRESSION: No active disease. Electronically Signed   By: Jasmine Pang M.D.   On: 04/23/2018 00:08    EKG: Ordered, not yet performed.   Assessment/Plan   1. DKA; insulin dependent DM  - Presents with malaise, abdominal discomfort, nausea, non-bloody vomiting after running out of insulin 3 days ago - No recent A1c, was >12% in 2016   - Found to be in DKA, fluid-resuscitated with 2 L NS, and started on insulin infusion in ED  - Continue insulin infusion with frequent CBG's and serial chem panels  - Transition to sq insulin DKA resolved and patient tolerating a diet; he is prescribed Novolog 15 units TID and Lantus 46 units qHS   2. AKI  - SCr is 1.46 on admission, up from apparent baseline of 0.5  - Likely prerenal  azotemia in setting of DKA, anticipate resolution with IVF hydration and glycemic-control  - Following serial chem panels as above     DVT prophylaxis: Lovenox Code Status: Full  Family Communication: Discussed with patient Consults called: None Admission status: Inpatient    Briscoe Deutscher, MD [facility] Pager [phone]  If 7PM-7AM, please contact night-coverage [email or web address] Password [email or web address]  04/23/2018, 12:30 AM

## 2018-04-23 NOTE — Progress Notes (Signed)
PROGRESS NOTE    Justin [name]  VWU:[id number] DOB: 08/08/1996 DOA: 04/22/2018 PCP: Quentin Angst, MD   Brief Narrative:  Justin [name] is a 22 y.o. HM PMHx  DM Type 1 uncontrolled with complication, noncompliance   Presents to the emergency department for evaluation of general malaise with abdominal discomfort, nausea, and nonbloody vomiting after running out of insulin 3 days ago.  Patient reports that he ran out of insulin approximately 3 days ago, continued to be in his usual state until this morning when he developed a general malaise with diffuse abdominal discomfort, nausea, and nonbloody emesis.  He denies recent fevers or chills and denies cough or diarrhea.   ED Course: Upon arrival to the ED, patient is found to be afebrile, saturating well on room air, slightly tachypneic and tachycardic, and with stable blood pressure.  Chest x-ray is negative for acute cardiopulmonary disease.  Chemistry panel is notable for slight hyponatremia, bicarbonate of 10, anion gap 27, glucose 509, and creatinine of 1.46, up from an apparent baseline of roughly 0.5.  CBC is unremarkable and urinalysis is notable for glucosuria and ketonuria.  Patient was treated with 2 L normal saline, 10 mEq of IV potassium, and started on insulin infusion in the ED.  Tachycardia resolved, blood pressure remained stable, and the patient will be admitted for ongoing evaluation and management of DKA in the setting of running out of insulin.    Subjective: 04/23/73, negative CP, negative S OB, negative abdominal pain.  Patient states has been diabetic since 22 years old.  Has no reason why he ran out of insulin (states it happens).   Assessment & Plan:   Active Problems:   DKA (diabetic ketoacidoses) (HCC)   AKI (acute kidney injury) (HCC)   Diabetes type 1 uncontrolled with complication/ DKA  - Presents with malaise, abdominal discomfort, nausea, non-bloody vomiting after running out of insulin 3 days ago - No  recent A1c, was >12% in 2016   - Found to be in DKA, fluid-resuscitated with 2 L NS, and started on insulin infusion in ED  - Continue insulin infusion with frequent CBG's and serial chem panels  - Lantus 35 units daily. after administration of Lantus discontinue insulin drip 2 hours post administration of medication  - Moderate SSI  -Hemoglobin A1c pending -Lipid panel pending      AKI  (baseline Cr -0.5) - SCr is 1.46 on admission, up from apparent baseline of 0.5  - Likely prerenal azotemia in setting of DKA, anticipate resolution with IVF hydration and glycemic-control  Recent Labs  Lab 04/22/18 2135 04/22/18 2237 04/23/18 0130 04/23/18 0547 04/23/18 1018 04/23/18 1552  CREATININE 1.46* 0.70 1.60* 1.44* 1.11 0.83    Hypokalemia - K-Dur 50 meq  Noncompliance with medication   - Counseled patient extensively on absolute need to take medication as prescribed.  Patient had no reason on why he ran out of insulin. - Consult placed for diabetic coordinator -Consult placed for diabetic nutrition education   DVT prophylaxis: Lovenox Code Status: Full Family Communication: None Disposition Plan: TBD   Consultants:    Procedures/Significant Events:     I have personally reviewed and interpreted all radiology studies and my findings are as above.  VENTILATOR SETTINGS:    Cultures   Antimicrobials:    Devices    LINES / TUBES:      Continuous Infusions: . sodium chloride Stopped (04/23/18 0723)  . dextrose 5 % and 0.45% NaCl 100 mL/hr at 04/23/18 1049  .  insulin (NOVOLIN-R) infusion 5.4 Units/hr (04/23/18 1700)     Objective: Vitals:   04/23/18 1513 04/23/18 1515 04/23/18 1545 04/23/18 1649  BP:  (!) 91/36 (!) 94/43 (!) 93/59  Pulse:  93 87 85  Resp:  14 14   Temp:      TempSrc:      SpO2: 95% 98% 99%   Weight:      Height:        Intake/Output Summary (Last 24 hours) at 04/23/2018 1749 Last data filed at 04/23/2018 1610 Gross per 24 hour    Intake 3200 ml  Output 2000 ml  Net 1200 ml   Filed Weights   04/22/18 2115  Weight: 184 lb (83.5 kg)    Examination:  General: A/O x4 no acute respiratory distress Neck:  Negative scars, masses, torticollis, lymphadenopathy, JVD Lungs: Clear to auscultation bilaterally without wheezes or crackles Cardiovascular: Regular rate and rhythm without murmur gallop or rub normal S1 and S2 Abdomen: negative abdominal pain, nondistended, positive soft, bowel sounds, no rebound, no ascites, no appreciable mass Extremities: No significant cyanosis, clubbing, or edema bilateral lower extremities Skin: Negative rashes, lesions, ulcers Psychiatric:  Negative depression, negative anxiety, negative fatigue, negative mania, appears to have poor understanding of disease process Central nervous system:  Cranial nerves II through XII intact, tongue/uvula midline, all extremities muscle strength 5/5, sensation intact throughout,  negative dysarthria, negative expressive aphasia, negative receptive aphasia.  .     Data Reviewed: Care during the described time interval was provided by me .  I have reviewed this patient's available data, including medical history, events of note, physical examination, and all test results as part of my evaluation.   CBC: Recent Labs  Lab 04/22/18 2135 04/22/18 2237  WBC 9.3  --   HGB 14.6 15.6  HCT 46.0 46.0  MCV 86.3  --   PLT 327  --    Basic Metabolic Panel: Recent Labs  Lab 04/22/18 2135 04/22/18 2237 04/23/18 0130 04/23/18 0547 04/23/18 1018 04/23/18 1552  NA 132* 134* 137 140 138 138  K 4.8 4.6 5.8* 4.6 3.6 3.3*  CL 95* 101 102 111 111 110  CO2 10*  --  <7* 9* 16* 21*  GLUCOSE 509* 503* 465* 206* 164* 181*  BUN CREATININE 1.46* 0.70 1.60* 1.44* 1.11 0.83  CALCIUM 9.2  --  9.0 8.1* 8.1* 8.3*   GFR: Estimated Creatinine Clearance: 144.3 mL/min (by C-G formula based on SCr of 0.83 mg/dL). Liver Function Tests: No results for  input(s): AST, ALT, ALKPHOS, BILITOT, PROT, ALBUMIN in the last 168 hours. No results for input(s): LIPASE, AMYLASE in the last 168 hours. No results for input(s): AMMONIA in the last 168 hours. Coagulation Profile: No results for input(s): INR, PROTIME in the last 168 hours. Cardiac Enzymes: No results for input(s): CKTOTAL, CKMB, CKMBINDEX, TROPONINI in the last 168 hours. BNP (last 3 results) No results for input(s): PROBNP in the last 8760 hours. HbA1C: Recent Labs    04/23/18 1702  HGBA1C 12.5*   CBG: Recent Labs  Lab 04/23/18 1144 04/23/18 1301 04/23/18 1439 04/23/18 1544 04/23/18 1656  GLUCAP 299* 281* 176* 187* 135*   Lipid Profile: No results for input(s): CHOL, HDL, LDLCALC, TRIG, CHOLHDL, LDLDIRECT in the last 72 hours. Thyroid Function Tests: No results for input(s): TSH, T4TOTAL, FREET4, T3FREE, THYROIDAB in the last 72 hours. Anemia Panel: No results for input(s): VITAMINB12, FOLATE, FERRITIN, TIBC, IRON, RETICCTPCT in the last 72  hours. Urine analysis:    Component Value Date/Time   COLORURINE STRAW (A) 04/22/2018 2121   APPEARANCEUR CLEAR 04/22/2018 2121   LABSPEC 1.028 04/22/2018 2121   PHURINE 5.0 04/22/2018 2121   GLUCOSEU >=500 (A) 04/22/2018 2121   HGBUR NEGATIVE 04/22/2018 2121   BILIRUBINUR NEGATIVE 04/22/2018 2121   BILIRUBINUR neg 11/24/2015 1303   KETONESUR 80 (A) 04/22/2018 2121   PROTEINUR NEGATIVE 04/22/2018 2121   UROBILINOGEN 0.2 11/24/2015 1303   UROBILINOGEN 0.2 11/04/2012 1623   NITRITE NEGATIVE 04/22/2018 2121   LEUKOCYTESUR NEGATIVE 04/22/2018 2121   Sepsis Labs: (procalcitonin:4,lacticidven:4)  )No results found for this or any previous visit (from the past 240 hour(s)).       Radiology Studies: Dg Chest Port 1 View  Result Date: 04/23/2018 CLINICAL DATA:  Short of breath EXAM: PORTABLE CHEST 1 VIEW COMPARISON:  07/16/2016 FINDINGS: The heart size and mediastinal contours are within normal limits. Both lungs  are clear. The visualized skeletal structures are unremarkable. IMPRESSION: No active disease. Electronically Signed   By: Jasmine Pang M.D.   On: 04/23/2018 00:08        Scheduled Meds: . enoxaparin (LOVENOX) injection  40 mg Subcutaneous Q24H   Continuous Infusions: . sodium chloride Stopped (04/23/18 0723)  . dextrose 5 % and 0.45% NaCl 100 mL/hr at 04/23/18 1049  . insulin (NOVOLIN-R) infusion 5.4 Units/hr (04/23/18 1700)     LOS: 0 days    Time spent: 40 minutes    WOODS, Roselind Messier, MD [facility] Pager [phone]   If 7PM-7AM, please contact night-coverage [email or web address] Password [facility] 04/23/2018, 5:49 PM

## 2018-04-24 ENCOUNTER — Encounter (HOSPITAL_COMMUNITY): Payer: Self-pay | Admitting: *Deleted

## 2018-04-24 DIAGNOSIS — Z9119 Patient's noncompliance with other medical treatment and regimen: Secondary | ICD-10-CM

## 2018-04-24 DIAGNOSIS — E1065 Type 1 diabetes mellitus with hyperglycemia: Secondary | ICD-10-CM

## 2018-04-24 DIAGNOSIS — E7849 Other hyperlipidemia: Secondary | ICD-10-CM

## 2018-04-24 DIAGNOSIS — E108 Type 1 diabetes mellitus with unspecified complications: Secondary | ICD-10-CM

## 2018-04-24 LAB — BASIC METABOLIC PANEL
Anion gap: 7 (ref 5–15)
BUN: 14 mg/dL (ref 6–20)
CALCIUM: 8.4 mg/dL — AB (ref 8.9–10.3)
CO2: 24 mmol/L (ref 22–32)
CREATININE: 0.68 mg/dL (ref 0.61–1.24)
Chloride: 110 mmol/L (ref 101–111)
GFR calc non Af Amer: >60 mL/min (ref >60)
Glucose, Bld: 182 mg/dL — ABNORMAL HIGH (ref 65–99)
Potassium: 2.8 mmol/L — ABNORMAL LOW (ref 3.5–5.1)
SODIUM: 141 mmol/L (ref 135–145)

## 2018-04-24 LAB — POTASSIUM: POTASSIUM: 3.3 mmol/L — AB (ref 3.5–5.1)

## 2018-04-24 LAB — GLUCOSE, CAPILLARY
GLUCOSE-CAPILLARY: 186 mg/dL — AB (ref 65–99)
GLUCOSE-CAPILLARY: 140 mg/dL — AB (ref 65–99)
GLUCOSE-CAPILLARY: 252 mg/dL — AB (ref 65–99)
GLUCOSE-CAPILLARY: 216 mg/dL — AB (ref 65–99)

## 2018-04-24 LAB — MAGNESIUM: MAGNESIUM: 1.8 mg/dL (ref 1.7–2.4)

## 2018-04-24 MED ORDER — INSULIN GLARGINE 100 UNIT/ML ~~LOC~~ SOLN
46.0000 [IU] | Freq: Every day | SUBCUTANEOUS | Status: DC
  Filled 2018-04-24: qty 0.46

## 2018-04-24 MED ORDER — POTASSIUM CHLORIDE CRYS ER 20 MEQ PO TBCR
40.0000 meq | EXTENDED_RELEASE_TABLET | Freq: Four times a day (QID) | ORAL | Status: AC
  Administered 2018-04-24 (×2): 40 meq via ORAL
  Filled 2018-04-24 (×2): qty 2

## 2018-04-24 NOTE — Care Management Note (Signed)
Case Management Note  Patient Details  Name: Justin [name] MRN: [id number] Date of Birth: 20-Oct-1996  Subjective/Objective:  Pt presented for Malaise, abd discomfort, N/V and states that he ran out of insulin. Pt is without any insurance at this time. Pt was previously going to the [facility]- however physician left and he has not scheduled a new appointment. PTA from home with family support. Pt states he does not work at this time.                 Action/Plan: CM did call the [facility] to schedule Hospital Follow up and new PCP appointment. Appointment scheduled for June 3rd @ 8:50 am- will place on AVS. Pt will be able to utilize the Pharmacy onsite and medications will range from $4.00-$10.00. No further needs from CM at this time.   Expected Discharge Date:                  Expected Discharge Plan:  Home/Self Care  In-House Referral:  NA  Discharge planning Services  CM Consult, Follow-up appt scheduled, Indigent Health Clinic, Medication Assistance  Post Acute Care Choice:  NA Choice offered to:  NA  DME Arranged:  N/A DME Agency:  NA  HH Arranged:  NA HH Agency:  NA  Status of Service:  Completed, signed off  If discussed at Long Length of Stay Meetings, dates discussed:    Additional Comments:  Gala Lewandowsky, RN 04/24/2018, 10:45 AM

## 2018-04-24 NOTE — Progress Notes (Signed)
Inpatient Diabetes Program Recommendations  AACE/ADA: New Consensus Statement on Inpatient Glycemic Control (2015)  Target Ranges:  Prepandial:   less than 140 mg/dL      Peak postprandial:   less than 180 mg/dL (1-2 hours)      Critically ill patients:  140 - 180 mg/dL   Lab Results  Component Value Date   GLUCAP 216 (H) 04/24/2018   HGBA1C 12.5 (H) 04/23/2018    Review of Glycemic Control  Long discussion with pt about his diabetes and glucose control. Pt states he missed appt at [facility] and tried to pick up his meds there, and was unable to get his Lantus and Novolog. Pt then went to [facility] and showed them his insulin pen, asking if he could get a generic more affordable insulin. They said No.  Pt appears depressed and stated he was depressed because of his diabetes. Would be willing to speak with psych regarding same. Would benefit from addition of meal coverage insulin. Discussed HgbA1C and goals, monitoring, diet and exercise. Pt wants to get blood sugars under control. Stressed importance of f/u with PCP.  Inpatient Diabetes Program Recommendations:     Add Novolog 5 units tidwc for meal coverage insulin if pt eats > 50% meal. Change Novolog to 0-9 units tidwc and hs. Consider psych consult for depression with chronic disease.  Continue to follow while inpatient.  Thank you. Ailene Ards, RD, LDN, CDE Inpatient Diabetes Coordinator [phone]

## 2018-04-24 NOTE — Progress Notes (Signed)
Nutrition Consult Education Note  RD consulted for nutrition education regarding type 1 diabetes. Patient was admitted on 5/21 with DKA r/t running out of insulin.   Lab Results  Component Value Date   HGBA1C 12.5 (H) 04/23/2018    Patient reports that he knows how to count carbohydrates and doses his Novolog insulin based on how many grams of carbohydrates he eats. He also takes Lantus once daily. He does not check his blood sugar at home because his glucometer doesn't work anymore. Encouraged him to obtain a new meter.  Discussed importance of controlled and consistent carbohydrate intake throughout the day. Provided examples of ways to balance meals/snacks and encouraged intake of high-fiber, whole grain complex carbohydrates. Teach back method used.  Expect fair compliance.  Body mass index is 28.61 kg/m. Pt meets criteria for overweight based on current BMI.  Current diet order is CHO modified, patient is consuming approximately 100% of meals at this time. Labs and medications reviewed. No further nutrition interventions warranted at this time. RD contact information provided. If additional nutrition issues arise, please re-consult RD.  Joaquin Courts, RD, LDN, CNSC Pager [phone] After Hours Pager [phone]

## 2018-04-24 NOTE — Progress Notes (Signed)
Patient left AMA. Patient signed AMA paper, placed on chart.  Stated he felt "depressed" being here. Patient stated he would get insulin tomorrow when asked if he would be able to obtain medication tonight.   Dr. Joseph Art notified.  IVs removed.  Telemetry removed.  Patient walked off of [facility].

## 2018-04-25 ENCOUNTER — Encounter (HOSPITAL_COMMUNITY): Payer: Self-pay | Admitting: Emergency Medicine

## 2018-04-25 ENCOUNTER — Emergency Department (HOSPITAL_COMMUNITY)
Admission: EM | Admit: 2018-04-25 | Discharge: 2018-04-25 | Disposition: A | Discharge status: Home / Self Care | Payer: Self-pay | Attending: Emergency Medicine | Admitting: Emergency Medicine

## 2018-04-25 ENCOUNTER — Other Ambulatory Visit: Payer: Self-pay

## 2018-04-25 DIAGNOSIS — E1065 Type 1 diabetes mellitus with hyperglycemia: Secondary | ICD-10-CM | POA: Insufficient documentation

## 2018-04-25 DIAGNOSIS — J45909 Unspecified asthma, uncomplicated: Secondary | ICD-10-CM | POA: Insufficient documentation

## 2018-04-25 DIAGNOSIS — Z794 Long term (current) use of insulin: Secondary | ICD-10-CM | POA: Insufficient documentation

## 2018-04-25 DIAGNOSIS — Z760 Encounter for issue of repeat prescription: Secondary | ICD-10-CM

## 2018-04-25 DIAGNOSIS — E109 Type 1 diabetes mellitus without complications: Secondary | ICD-10-CM

## 2018-04-25 DIAGNOSIS — IMO0001 Reserved for inherently not codable concepts without codable children: Secondary | ICD-10-CM

## 2018-04-25 LAB — CBC WITH DIFFERENTIAL/PLATELET
Abs Immature Granulocytes: 0 10*3/uL (ref 0.0–0.1)
BASOS ABS: 0 10*3/uL (ref 0.0–0.1)
Basophils Relative: 0 %
EOS ABS: 0 10*3/uL (ref 0.0–0.7)
EOS PCT: 0 %
HCT: 39.1 % (ref 39.0–52.0)
Hemoglobin: 12.6 g/dL — ABNORMAL LOW (ref 13.0–17.0)
IMMATURE GRANULOCYTES: 0 %
Lymphocytes Relative: 35 %
Lymphs Abs: 1.1 10*3/uL (ref 0.7–4.0)
MCH: 27.5 pg (ref 26.0–34.0)
MCHC: 32.2 g/dL (ref 30.0–36.0)
MCV: 85.2 fL (ref 78.0–100.0)
Monocytes Absolute: 0.3 10*3/uL (ref 0.1–1.0)
Monocytes Relative: 9 %
NEUTROS PCT: 56 %
Neutro Abs: 1.7 10*3/uL (ref 1.7–7.7)
PLATELETS: 250 10*3/uL (ref 150–400)
RBC: 4.59 MIL/uL (ref 4.22–5.81)
RDW: 14.2 % (ref 11.5–15.5)
WBC: 3 10*3/uL — AB (ref 4.0–10.5)

## 2018-04-25 LAB — URINALYSIS, ROUTINE W REFLEX MICROSCOPIC
BILIRUBIN URINE: NEGATIVE
Bacteria, UA: NONE SEEN
HGB URINE DIPSTICK: NEGATIVE
KETONES UR: 20 mg/dL — AB
LEUKOCYTES UA: NEGATIVE
NITRITE: NEGATIVE
Protein, ur: NEGATIVE mg/dL
Specific Gravity, Urine: 1.036 — ABNORMAL HIGH (ref 1.005–1.030)
pH: 6 (ref 5.0–8.0)

## 2018-04-25 LAB — BASIC METABOLIC PANEL
Anion gap: 9 (ref 5–15)
BUN: 14 mg/dL (ref 6–20)
CALCIUM: 9.1 mg/dL (ref 8.9–10.3)
CO2: 26 mmol/L (ref 22–32)
CREATININE: 0.66 mg/dL (ref 0.61–1.24)
Chloride: 103 mmol/L (ref 101–111)
GFR calc Af Amer: >60 mL/min (ref >60)
GFR calc non Af Amer: >60 mL/min (ref >60)
Glucose, Bld: 298 mg/dL — ABNORMAL HIGH (ref 65–99)
Potassium: 3.7 mmol/L (ref 3.5–5.1)
SODIUM: 138 mmol/L (ref 135–145)

## 2018-04-25 LAB — CBG MONITORING, ED: GLUCOSE-CAPILLARY: 294 mg/dL — AB (ref 65–99)

## 2018-04-25 MED ORDER — "INSULIN SYRINGE-NEEDLE U-100 31G X 5/16"" 1 ML MISC": 0 refills | Status: AC

## 2018-04-25 MED ORDER — INSULIN ASPART 100 UNIT/ML FLEXPEN: PEN_INJECTOR | SUBCUTANEOUS | 3 refills | Status: DC

## 2018-04-25 MED ORDER — GLUCOSE BLOOD VI STRP: ORAL_STRIP | 12 refills | Status: AC

## 2018-04-25 MED ORDER — ACCU-CHEK SOFT TOUCH LANCETS MISC
12 refills | Status: AC
Start: 2018-04-25 — End: ?

## 2018-04-25 MED ORDER — INSULIN GLARGINE 100 UNIT/ML SOLOSTAR PEN: 46.0000 [IU] | PEN_INJECTOR | Freq: Every day | SUBCUTANEOUS | 3 refills | Status: DC

## 2018-04-25 NOTE — Discharge Instructions (Signed)
Please follow up closely with your primary care provider for further management of your health.

## 2018-04-25 NOTE — ED Notes (Signed)
Patient also states he does not have a glucose meter at home.

## 2018-04-25 NOTE — ED Triage Notes (Signed)
Patient recently left AMA while admitted for DKA, requesting prescription for novolog and lantus. Patient has appointment with [facility] at June 3rd for continued management of medications. Denies any complaints at this time.

## 2018-04-25 NOTE — ED Provider Notes (Signed)
Patient placed in Quick Look pathway, seen and evaluated   Chief Complaint: medication refill.   HPI:   Patient is a 22 year old male with a history of type 1 diabetes mellitus and asthma who presents the emergency department requesting refill of his insulin.  Patient with recent admission for DKA 04/22/18.  Left AMA from the hospital yesterday afternoon with blood glucose at 216. Presenting today requesting refill of his insulin prior to appointment with [facility] June 3rd.   ROS: Negative for nausea/vomiting or abdominal pain  Negative for chest pain or dyspnea  Physical Exam:   Gen: No distress  Neuro: Awake and Alert  Skin: Warm    Focused Exam: Abdomen: soft, nontender. Heart; RRR   Initiation of care has begun. The patient has been counseled on the process, plan, and necessity for staying for the completion/evaluation, and the remainder of the medical screening examination.   Instructed patient to alert staff should his symptoms change or should he have any concerns   Cherly Anderson, PA-C 04/25/18 1454    Pricilla Loveless, MD 04/28/18 [phone]

## 2018-04-25 NOTE — Discharge Summary (Signed)
Physician Discharge Summary  Justin [name]:[id number] DOB: 1996/10/17 DOA: 04/22/2018  PCP: Quentin Angst, MD  Admit date: 04/22/2018 Discharge date: 04/24/2018  Time spent: 35 minutes  Recommendations for Outpatient Follow-up:   Diabetes type 1 uncontrolled with complication/ DKA  - Presents with malaise, abdominal discomfort, nausea, non-bloody vomiting after running out of insulin 3 days ago -5/22 hemoglobin A1c 12.5  -Counseled patient and mother that currently CBG still uncontrolled and should remain overnight for [place] to better control sugars prior to discharge.  Patient left AMA   HLD -Lipid panel not within ADA guidelines -Patient left AMA     AKI  (baseline Cr -0.5) - SCr is 1.46 on admission, up from apparent baseline of 0.5  - Likely prerenal azotemia in setting of DKA, anticipate resolution with IVF hydration and glycemic-control  Recent Labs  Lab 04/22/18 2135 04/22/18 2237 04/23/18 0130 04/23/18 0547 04/23/18 1018 04/23/18 1552 04/24/18 0318  CREATININE 1.46* 0.70 1.60* 1.44* 1.11 0.83 0.68  ]    Hypokalemia -Patient left AMA     Noncompliance with medication   - Counseled patient extensively on absolute need to take medication as prescribed.  Patient had no reason on why he ran out of insulin. - Consult placed for diabetic coordinator -Consult placed for diabetic nutrition education -Patient left AMA       Discharge Diagnoses:  Active Problems:   DKA (diabetic ketoacidoses) (HCC)   AKI (acute kidney injury) (HCC)   Discharge Condition: Guarded  Diet recommendation: Diabetic  Filed Weights   04/22/18 2115 04/24/18 0526  Weight: 184 lb (83.5 kg) 182 lb 11.2 oz (82.9 kg)    History of present illness:  Justin [name] is a 22 y.o. HM PMHx  DM Type 1 uncontrolled with complication, noncompliance    Presents to the emergency department for evaluation of general malaise with abdominal discomfort, nausea, and nonbloody vomiting after running  out of insulin 3 days ago.  Patient reports that he ran out of insulin approximately 3 days ago, continued to be in his usual state until this morning when he developed a general malaise with diffuse abdominal discomfort, nausea, and nonbloody emesis.  He denies recent fevers or chills and denies cough or diarrhea.   ED Course: Upon arrival to the ED, patient is found to be afebrile, saturating well on room air, slightly tachypneic and tachycardic, and with stable blood pressure.  Chest x-ray is negative for acute cardiopulmonary disease.  Chemistry panel is notable for slight hyponatremia, bicarbonate of 10, anion gap 27, glucose 509, and creatinine of 1.46, up from an apparent baseline of roughly 0.5.  CBC is unremarkable and urinalysis is notable for glucosuria and ketonuria.  Patient was treated with 2 L normal saline, 10 mEq of IV potassium, and started on insulin infusion in the ED.  Tachycardia resolved, blood pressure remained stable, and the patient will be admitted for ongoing evaluation and management of DKA in the setting of running out of insulin. Placed on glucose stabilizer, secondary to DKA.  Advised that his current CBG still not well controlled and should remain until he has CBGs <200 over a  24-hour period, however.Patient left AMA      Discharge Exam: Vitals:   04/24/18 0043 04/24/18 0526 04/24/18 0726 04/24/18 1143  BP: 109/69 98/60 (!) 98/55 (!) 102/57  Pulse: 80 72 69 63  Resp:      Temp: 98.8 F (37.1 C) 98.6 F (37 C)  98 F (36.7 C)  TempSrc: Oral Oral  Oral  SpO2: 100% 100%  100%  Weight:  182 lb 11.2 oz (82.9 kg)    Height:        General: A/O x4 no acute respiratory distress Neck:  Negative scars, masses, torticollis, lymphadenopathy, JVD Lungs: Clear to auscultation bilaterally without wheezes or crackles Cardiovascular: Regular rate and rhythm without murmur gallop or rub normal S1 and S2 Abdomen: negative abdominal pain, nondistended, positive soft, bowel  sounds, no rebound, no ascites, no appreciable mass    Discharge Instructions   Allergies as of 04/24/2018   No Known Allergies     Medication List    ASK your doctor about these medications   accu-chek soft touch lancets Check sugars ACHS for E10.9   glucose blood test strip Commonly known as:  ACCU-CHEK AVIVA Check sugars ACHS for E10.9   insulin aspart 100 UNIT/ML FlexPen Commonly known as:  NOVOLOG FLEXPEN INJECT 5-15 units into skin 3 times daily. Increase by 1 unit per 10 carbs.   Insulin Glargine 100 UNIT/ML Solostar Pen Commonly known as:  LANTUS SOLOSTAR Inject 46 Units into the skin daily. UP TO 50 UNITS PER DAY AS DIRECTED BY MD   Insulin Syringe-Needle U-100 31G X 5/16" 1 ML Misc Commonly known as:  TRUEPLUS INSULIN SYRINGE Use as directed to inject insulin   potassium chloride SA 20 MEQ tablet Commonly known as:  K-DUR,KLOR-CON Take 2 tablets (40 mEq total) by mouth once.      No Known Allergies Follow-up Information    [facility] Follow up on 05/05/2018.   Why:  @ 8:50 am for hospital follow up with Dr. Alvis Lemmings. Please call the office to reschedule if you can not make this scheduled appointment time. Pt can utilize the Pharmacy onsite medications will range from $4.00-$10.00 Contact information: [address] [facility] [address] [phone]           The results of significant diagnostics from this hospitalization (including imaging, microbiology, ancillary and laboratory) are listed below for reference.    Significant Diagnostic Studies: Dg Chest Port 1 View  Result Date: 04/23/2018 CLINICAL DATA:  Short of breath EXAM: PORTABLE CHEST 1 VIEW COMPARISON:  07/16/2016 FINDINGS: The heart size and mediastinal contours are within normal limits. Both lungs are clear. The visualized skeletal structures are unremarkable. IMPRESSION: No active disease. Electronically Signed   By: Jasmine Pang M.D.    On: 04/23/2018 00:08    Microbiology: No results found for this or any previous visit (from the past 240 hour(s)).   Labs: Basic Metabolic Panel: Recent Labs  Lab 04/23/18 0130 04/23/18 0547 04/23/18 1018 04/23/18 1552 04/24/18 0318 04/24/18 0917  NA 137 140 138 138 141  --   K 5.8* 4.6 3.6 3.3* 2.8* 3.3*  CL 102 111 111 110 110  --   CO2 <7* 9* 16* 21* 24  --   GLUCOSE 465* 206* 164* 181* 182*  --   BUN --   CREATININE 1.60* 1.44* 1.11 0.83 0.68  --   CALCIUM 9.0 8.1* 8.1* 8.3* 8.4*  --   MG  --   --   --   --  1.8  --    Liver Function Tests: No results for input(s): AST, ALT, ALKPHOS, BILITOT, PROT, ALBUMIN in the last 168 hours. No results for input(s): LIPASE, AMYLASE in the last 168 hours. No results for input(s): AMMONIA in the last 168 hours. CBC: Recent Labs  Lab  04/22/18 2135 04/22/18 2237  WBC 9.3  --   HGB 14.6 15.6  HCT 46.0 46.0  MCV 86.3  --   PLT 327  --    Cardiac Enzymes: No results for input(s): CKTOTAL, CKMB, CKMBINDEX, TROPONINI in the last 168 hours. BNP: BNP (last 3 results) No results for input(s): BNP in the last 8760 hours.  ProBNP (last 3 results) No results for input(s): PROBNP in the last 8760 hours.  CBG: Recent Labs  Lab 04/23/18 2355 04/24/18 0529 04/24/18 0724 04/24/18 1140 04/24/18 1554  GLUCAP 261* 186* 140* 252* 216*       Signed:  Carolyne Littles, MD [facility] [phone] pager

## 2018-04-25 NOTE — ED Provider Notes (Signed)
[facility] EMERGENCY DEPARTMENT Provider Note   CSN: [id number] Arrival date & time: 04/25/18  1436     History   Chief Complaint Chief Complaint  Patient presents with  . Medication Refill    HPI Justin [name] is a 22 y.o. male.  HPI   22 year old male with known history of type 1 diabetes that is noncompliant, history of asthma, depression, presenting to the ED today requesting for medication refill.  Patient mention he was recently admitted to the hospital 4 days ago for DKA.  He left AMA yesterday when he felt better.  He was told that he can follow-up with his PCP [facility]  He went to his clinic today but was told that he does not have any medication for refill therefore patient returns to the ER requesting for medication refill.  Currently he has no specific complaint such as fever, abdominal pain, nausea vomiting diarrhea or generalized weakness.  Past Medical History:  Diagnosis Date  . Allergy    allergic rhinitis  . Asthma    as a child, hospitalized at age 22  . Depression   . Diabetes mellitus without complication (HCC)   . Obesity     Patient Active Problem List   Diagnosis Date Noted  . DKA (diabetic ketoacidoses) (HCC) 04/23/2018  . AKI (acute kidney injury) (HCC) 04/23/2018  . Type 1 diabetes mellitus, uncontrolled (HCC) 11/13/2012  . Noncompliance with diabetes treatment 11/04/2012  . Hyperglycemia 11/04/2012  . Type 1 diabetes mellitus on insulin therapy (HCC) 08/05/2012    Past Surgical History:  Procedure Laterality Date  . CIRCUMCISION          Home Medications    Prior to Admission medications   Medication Sig Start Date End Date Taking? Authorizing Provider  insulin aspart (NOVOLOG FLEXPEN) 100 UNIT/ML FlexPen INJECT 5-15 units into skin 3 times daily. Increase by 1 unit per 10 carbs. Patient taking differently: Inject 5-15 Units into the skin 3 (three) times daily with  meals. Per sliding scale: CBG 150-200 1 unit, 201-250 2 units, 251-300 3 units, 301-350 4 units, 351-400 5 units PLUS meal adjustment 1 unit per 10 carbs 02/04/17  Yes Jegede, Olugbemiga E, MD  Insulin Glargine (LANTUS SOLOSTAR) 100 UNIT/ML Solostar Pen Inject 46 Units into the skin daily. UP TO 50 UNITS PER DAY AS DIRECTED BY MD Patient taking differently: Inject 40 Units into the skin at bedtime.  02/04/17  Yes Quentin Angst, MD  glucose blood (ACCU-CHEK AVIVA) test strip Check sugars ACHS for E10.9 11/24/15   Ambrose Finland, NP  Insulin Syringe-Needle U-100 (TRUEPLUS INSULIN SYRINGE) 31G X 5/16" 1 ML MISC Use as directed to inject insulin 08/26/17   Quentin Angst, MD  Lancets (ACCU-CHEK SOFT TOUCH) lancets Check sugars ACHS for E10.9 11/24/15   Ambrose Finland, NP    Family History Family History  Problem Relation Age of Onset  . Cancer Neg Hx   . Diabetes Neg Hx   . Heart failure Neg Hx   . Hyperlipidemia Neg Hx   . Thyroid disease Neg Hx   . Stroke Neg Hx     Social History Social History   Tobacco Use  . Smoking status: Never Smoker  . Smokeless tobacco: Never Used  Substance Use Topics  . Alcohol use: No  . Drug use: Yes    Types: Marijuana     Allergies   Patient has no known allergies.  Review of Systems Review of Systems  All other systems reviewed and are negative.    Physical Exam Updated Vital Signs BP 122/78 (BP Location: Right Arm)   Pulse 74   Temp 97.6 F (36.4 C) (Oral)   Resp 16   SpO2 99%   Physical Exam  Constitutional: He is oriented to person, place, and time. He appears well-developed and well-nourished. No distress.  HENT:  Head: Atraumatic.  Eyes: Conjunctivae are normal.  Neck: Neck supple.  Cardiovascular: Normal rate and regular rhythm.  Pulmonary/Chest: Effort normal and breath sounds normal.  Abdominal: He exhibits no distension. There is no tenderness.  Neurological: He is alert and oriented to person, place, and  time.  Skin: No rash noted.  Psychiatric: He has a normal mood and affect.  Nursing note and vitals reviewed.    ED Treatments / Results  Labs (all labs ordered are listed, but only abnormal results are displayed) Labs Reviewed  BASIC METABOLIC PANEL - Abnormal; Notable for the following components:      Result Value   Glucose, Bld 298 (*)    All other components within normal limits  CBC WITH DIFFERENTIAL/PLATELET - Abnormal; Notable for the following components:   WBC 3.0 (*)    Hemoglobin 12.6 (*)    All other components within normal limits  URINALYSIS, ROUTINE W REFLEX MICROSCOPIC - Abnormal; Notable for the following components:   Specific Gravity, Urine 1.036 (*)    Glucose, UA >=500 (*)    Ketones, ur 20 (*)    All other components within normal limits  CBG MONITORING, ED - Abnormal; Notable for the following components:   Glucose-Capillary 294 (*)    All other components within normal limits    EKG None  Radiology No results found.  Procedures Procedures (including critical care time)  Medications Ordered in ED Medications - No data to display   Initial Impression / Assessment and Plan / ED Course  I have reviewed the triage vital signs and the nursing notes.  Pertinent labs & imaging results that were available during my care of the patient were reviewed by me and considered in my medical decision making (see chart for details).     BP 122/78 (BP Location: Right Arm)   Pulse 74   Temp 97.6 F (36.4 C) (Oral)   Resp 16   SpO2 99%    Final Clinical Impressions(s) / ED Diagnoses   Final diagnoses:  Medication refill  Hyperglycemia due to type 1 diabetes mellitus [facility])    ED Discharge Orders        Ordered    insulin aspart (NOVOLOG FLEXPEN) 100 UNIT/ML FlexPen    Note to Pharmacy:  sample   04/25/18 1928    Insulin Glargine (LANTUS SOLOSTAR) 100 UNIT/ML Solostar Pen  Daily    Note to Pharmacy:  Sample to bring to visit   04/25/18 1928     Insulin Syringe-Needle U-100 (TRUEPLUS INSULIN SYRINGE) 31G X 5/16" 1 ML MISC    Note to Pharmacy:  Must have office visit for refills   04/25/18 1928    Lancets (ACCU-CHEK SOFT TOUCH) lancets     04/25/18 1928    glucose blood (ACCU-CHEK AVIVA) test strip     04/25/18 1928     7:26 PM Patient is here requesting for refill of his insulin after being discharged yesterday after treatment of DKA.  He currently denies having any active symptoms.  Labs remarkable for a CBG of 298 with normal anion  gap.  20 ketones in his urine.  Will refill medication and strongly encourage pt to f/u with PCP for further care.    Fayrene Helper, PA-C 04/25/18 1930    Loren Racer, MD 04/25/18 2213

## 2018-04-29 MED FILL — !LANTUS SOLOSTAR 100UNITS/M: 100 | 30 days supply | Qty: 15 | Fill #0

## 2018-04-29 MED FILL — NOVOLOG FLEXPEN SYRINGE: 100 | 26 days supply | Qty: 12 | Fill #0

## 2018-05-05 ENCOUNTER — Other Ambulatory Visit: Payer: Self-pay

## 2018-05-05 ENCOUNTER — Ambulatory Visit: Payer: Self-pay | Attending: Family Medicine | Admitting: Family Medicine

## 2018-05-05 ENCOUNTER — Encounter: Payer: Self-pay | Admitting: Family Medicine

## 2018-05-05 VITALS — BP 104/68 | HR 76 | Temp 97.8°F | Resp 16 | Ht 67.5 in | Wt 178.0 lb

## 2018-05-05 DIAGNOSIS — Z91199 Patient's noncompliance with other medical treatment and regimen due to unspecified reason: Secondary | ICD-10-CM

## 2018-05-05 DIAGNOSIS — Z9119 Patient's noncompliance with other medical treatment and regimen: Secondary | ICD-10-CM | POA: Insufficient documentation

## 2018-05-05 DIAGNOSIS — Z794 Long term (current) use of insulin: Secondary | ICD-10-CM | POA: Insufficient documentation

## 2018-05-05 DIAGNOSIS — E109 Type 1 diabetes mellitus without complications: Secondary | ICD-10-CM | POA: Insufficient documentation

## 2018-05-05 LAB — GLUCOSE, POCT (MANUAL RESULT ENTRY): POC GLUCOSE: 306 mg/dL — AB (ref 70–99)

## 2018-05-05 LAB — POCT CBG (FASTING - GLUCOSE)-MANUAL ENTRY: GLUCOSE FASTING, POC: 349 mg/dL — AB (ref 70–99)

## 2018-05-05 MED ORDER — INSULIN ASPART 100 UNIT/ML ~~LOC~~ SOLN
6.0000 [IU] | Freq: Once | SUBCUTANEOUS | Status: AC
  Administered 2018-05-05: 6 [IU] via SUBCUTANEOUS

## 2018-05-05 MED ORDER — INSULIN GLARGINE 100 UNIT/ML SOLOSTAR PEN
50.0000 [IU] | PEN_INJECTOR | Freq: Every day | SUBCUTANEOUS | 3 refills | Status: DC
Start: 2018-05-05 — End: 2018-06-30

## 2018-05-05 NOTE — Progress Notes (Signed)
Hospital F/u  Medication RF

## 2018-05-05 NOTE — Progress Notes (Signed)
Subjective:  Patient ID: Justin [name], male    DOB: [birth year]  Age: 22 y.o. MRN: [id number]  CC: Hospitalization Follow-up   HPI Justin [name] is a 22 year old male with a history of type 2 diabetes mellitus (A1c 12.5), noncompliance with medication regimen who presents today for a follow-up visit.  He attributes noncompliance to inability to afford his medications. He had a hospitalization at [facility] from 04/22/2018 through 04/24/2018 for diabetic ketoacidosis and acute kidney injury in the setting of hyperglycemia but left AMA.  He returned to the ED 1 day after leaving requesting refills of his medications which time renal function had normalized and he received refills. His blood sugar is 349 in the clinic and he endorses taking 46 units of Lantus last night but does not have his NovoLog.  His Novolog regimen is 1 unit of NovoLog for every 50 units above 150. He has no additional concerns today.  Past Medical History:  Diagnosis Date  . Allergy    allergic rhinitis  . Asthma    as a child, hospitalized at age 43  . Depression   . Diabetes mellitus without complication (HCC)   . Obesity     Past Surgical History:  Procedure Laterality Date  . CIRCUMCISION      No Known Allergies   Outpatient Medications Prior to Visit  Medication Sig Dispense Refill  . glucose blood (ACCU-CHEK AVIVA) test strip Check sugars ACHS for E10.9 100 each 12  . insulin aspart (NOVOLOG FLEXPEN) 100 UNIT/ML FlexPen INJECT 5-15 units into skin 3 times daily. Increase by 1 unit per 10 carbs. 45 mL 3  . Insulin Syringe-Needle U-100 (TRUEPLUS INSULIN SYRINGE) 31G X 5/16" 1 ML MISC Use as directed to inject insulin 100 each 0  . Lancets (ACCU-CHEK SOFT TOUCH) lancets Check sugars ACHS for E10.9 100 each 12  . Insulin Glargine (LANTUS SOLOSTAR) 100 UNIT/ML Solostar Pen Inject 46 Units into the skin daily. UP TO 50 UNITS PER DAY AS DIRECTED BY MD 45 mL 3   No facility-administered medications  prior to visit.     ROS Review of Systems  Constitutional: Negative for activity change and appetite change.  HENT: Negative for sinus pressure and sore throat.   Eyes: Negative for visual disturbance.  Respiratory: Negative for cough, chest tightness and shortness of breath.   Cardiovascular: Negative for chest pain and leg swelling.  Gastrointestinal: Negative for abdominal distention, abdominal pain, constipation and diarrhea.  Endocrine: Negative.   Genitourinary: Negative for dysuria.  Musculoskeletal: Negative for joint swelling and myalgias.  Skin: Negative for rash.  Allergic/Immunologic: Negative.   Neurological: Negative for weakness, light-headedness and numbness.  Psychiatric/Behavioral: Negative for dysphoric mood and suicidal ideas.    Objective:  BP 104/68 (BP Location: Right Arm, Patient Position: Sitting, Cuff Size: Normal)   Pulse 76   Temp 97.8 F (36.6 C) (Oral)   Resp 16   Ht 5' 7.5" (1.715 m)   Wt 178 lb (80.7 kg)   SpO2 97%   BMI 27.47 kg/m   BP/Weight 05/05/2018 04/25/2018 04/24/2018  Systolic BP 104 122 102  Diastolic BP 68 78 57  Wt. (Lbs) 178 - 182.7  BMI 27.47 - 28.61      Physical Exam  Constitutional: He is oriented to person, place, and time. He appears well-developed and well-nourished.  Cardiovascular: Normal rate, normal heart sounds and intact distal pulses.  No murmur heard. Pulmonary/Chest: Effort normal and breath sounds normal. He has no wheezes. He has  no rales. He exhibits no tenderness.  Abdominal: Soft. Bowel sounds are normal. He exhibits no distension and no mass. There is no tenderness.  Musculoskeletal: Normal range of motion.  Neurological: He is alert and oriented to person, place, and time.  Skin: Skin is warm and dry.  Psychiatric: He has a normal mood and affect.    CMP Latest Ref Rng & Units 04/25/2018 04/24/2018 04/24/2018  Glucose 65 - 99 mg/dL 562(Z298(H) - 308(M182(H)  BUN 6 - 20 mg/dL 14 - 14  Creatinine 5.780.61 - 1.24  mg/dL 4.690.66 - 6.290.68  Sodium 528135 - 145 mmol/L 138 - 141  Potassium 3.5 - 5.1 mmol/L 3.7 3.3(L) 2.8(L)  Chloride 101 - 111 mmol/L 103 - 110  CO2 22 - 32 mmol/L 26 - 24  Calcium 8.9 - 10.3 mg/dL 9.1 - 8.4(L)  Total Protein 6.0 - 8.3 g/dL - - -  Total Bilirubin 0.2 - 1.1 mg/dL - - -  Alkaline Phos 39 - 117 U/L - - -  AST 0 - 37 U/L - - -  ALT 0 - 53 U/L - - -     Lab Results  Component Value Date   HGBA1C 12.5 (H) 04/23/2018    Assessment & Plan:   1. Type 1 diabetes mellitus on insulin therapy (HCC) Uncontrolled with A1c of 12.5 due to noncompliance Increased dose of Lantus to 50 units at bedtime He will need to work with the pharmacy to obtain the blue card so he can receive his NovoLog 6 units of NovoLog administered due to CBG of 349 Comply with a diabetic diet, lifestyle modifications - Glucose (CBG), Fasting - insulin aspart (novoLOG) injection 6 Units - POCT glucose (manual entry)  2. Noncompliance with diabetes treatment We have discussed the implications and complications of noncompliance  3. Type 1 diabetes mellitus without complication (HCC) - Insulin Glargine (LANTUS SOLOSTAR) 100 UNIT/ML Solostar Pen; Inject 50 Units into the skin daily.  Dispense: 45 mL; Refill: 3   Meds ordered this encounter  Medications  . insulin aspart (novoLOG) injection 6 Units  . Insulin Glargine (LANTUS SOLOSTAR) 100 UNIT/ML Solostar Pen    Sig: Inject 50 Units into the skin daily.    Dispense:  45 mL    Refill:  3    Sample to bring to visit    Follow-up: Return in about 1 month (around 06/04/2018).   Hoy RegisterEnobong Kinzly Pierrelouis MD

## 2018-05-12 ENCOUNTER — Ambulatory Visit: Payer: Self-pay | Attending: Family Medicine

## 2018-06-23 ENCOUNTER — Ambulatory Visit: Payer: Medicaid Other | Admitting: Family Medicine

## 2018-06-24 MED FILL — !LANTUS SOLOSTAR 100UNITS/M: 100 | 30 days supply | Qty: 15 | Fill #1

## 2018-06-24 MED FILL — !NOVOLOG FLEXPEN SYRINGE 1: 100/ML | 26 days supply | Qty: 12 | Fill #1

## 2018-06-30 ENCOUNTER — Encounter: Payer: Self-pay | Admitting: Family Medicine

## 2018-06-30 ENCOUNTER — Ambulatory Visit: Payer: Self-pay | Attending: Family Medicine | Admitting: Family Medicine

## 2018-06-30 VITALS — BP 129/71 | HR 83 | Temp 98.1°F | Ht 67.5 in | Wt 180.6 lb

## 2018-06-30 DIAGNOSIS — Z794 Long term (current) use of insulin: Secondary | ICD-10-CM | POA: Insufficient documentation

## 2018-06-30 DIAGNOSIS — E109 Type 1 diabetes mellitus without complications: Secondary | ICD-10-CM

## 2018-06-30 DIAGNOSIS — E1065 Type 1 diabetes mellitus with hyperglycemia: Secondary | ICD-10-CM | POA: Insufficient documentation

## 2018-06-30 LAB — GLUCOSE, POCT (MANUAL RESULT ENTRY): POC GLUCOSE: 505 mg/dL — AB (ref 70–99)

## 2018-06-30 MED ORDER — GLUCOSE BLOOD VI STRP: ORAL_STRIP | 12 refills | Status: AC

## 2018-06-30 MED ORDER — INSULIN GLARGINE 100 UNIT/ML SOLOSTAR PEN: 55.0000 [IU] | PEN_INJECTOR | Freq: Every day | SUBCUTANEOUS | 3 refills | Status: DC

## 2018-06-30 MED ORDER — TRUE METRIX METER DEVI: 1.0000 | Freq: Three times a day (TID) | 0 refills | Status: AC

## 2018-06-30 MED ORDER — TRUEPLUS LANCETS 28G MISC: 1.0000 | Freq: Three times a day (TID) | 12 refills | Status: AC

## 2018-06-30 NOTE — Progress Notes (Signed)
Subjective:  Patient ID: Justin [name], male    DOB: December 30, 1995  Age: 22 y.o. MRN: [id number]  CC: Diabetes   HPI Justin [name] is a 22 year old male with a history of type 2 diabetes mellitus (A1c 12.5), previous noncompliance with medication regimen presents today for follow-up visit. He endorses compliance with his insulins and his lowest fasting sugar has been 150 with daytime sugars elevated in the 200s 300s.  His blood sugar is 505 in the clinic today and he just had lunch 30 minutes ago and took 15 units of NovoLog. He has no additional concerns today.  Past Medical History:  Diagnosis Date  . Allergy    allergic rhinitis  . Asthma    as a child, hospitalized at age 27  . Depression   . Diabetes mellitus without complication (HCC)   . Obesity     Past Surgical History:  Procedure Laterality Date  . CIRCUMCISION      No Known Allergies   Outpatient Medications Prior to Visit  Medication Sig Dispense Refill  . glucose blood (ACCU-CHEK AVIVA) test strip Check sugars ACHS for E10.9 100 each 12  . insulin aspart (NOVOLOG FLEXPEN) 100 UNIT/ML FlexPen INJECT 5-15 units into skin 3 times daily. Increase by 1 unit per 10 carbs. 45 mL 3  . Insulin Syringe-Needle U-100 (TRUEPLUS INSULIN SYRINGE) 31G X 5/16" 1 ML MISC Use as directed to inject insulin 100 each 0  . Lancets (ACCU-CHEK SOFT TOUCH) lancets Check sugars ACHS for E10.9 100 each 12  . Insulin Glargine (LANTUS SOLOSTAR) 100 UNIT/ML Solostar Pen Inject 50 Units into the skin daily. 45 mL 3   No facility-administered medications prior to visit.     ROS Review of Systems General: negative for fever, weight loss, appetite change Eyes: no visual symptoms. ENT: no ear symptoms, no sinus tenderness, no nasal congestion or sore throat. Neck: no pain  Respiratory: no wheezing, shortness of breath, cough Cardiovascular: no chest pain, no dyspnea on exertion, no pedal edema, no orthopnea. Gastrointestinal: no abdominal  pain, no diarrhea, no constipation Genito-Urinary: no urinary frequency, no dysuria, no polyuria. Hematologic: no bruising Endocrine: no cold or heat intolerance Neurological: no headaches, no seizures, no tremors Musculoskeletal: no joint pains, no joint swelling Skin: no pruritus, no rash. Psychological: no depression, no anxiety,    Objective:  BP 129/71   Pulse 83   Temp 98.1 F (36.7 C) (Oral)   Ht 5' 7.5" (1.715 m)   Wt 180 lb 9.6 oz (81.9 kg)   SpO2 99%   BMI 27.87 kg/m   BP/Weight 06/30/2018 05/05/2018 04/25/2018  Systolic BP 129 104 122  Diastolic BP 71 68 78  Wt. (Lbs) 180.6 178 -  BMI 27.87 27.47 -      Physical Exam Constitutional: normal appearing,  Eyes: PERRLA HEENT: Head is atraumatic, normal sinuses, normal oropharynx, normal appearing tonsils and palate, tympanic membrane is normal bilaterally. Neck: normal range of motion, no thyromegaly, no JVD Cardiovascular: normal rate and rhythm, normal heart sounds, no murmurs, rub or gallop, no pedal edema Respiratory: clear to auscultation bilaterally, no wheezes, no rales, no rhonchi Abdomen: soft, not tender to palpation, normal bowel sounds, no enlarged organs Extremities: Full ROM, no tenderness in joints Skin: warm and dry, no lesions. Neurological: alert, oriented x3, cranial nerves I-XII grossly intact , normal motor strength, normal sensation. Psychological: normal mood.    CMP Latest Ref Rng & Units 04/25/2018 04/24/2018 04/24/2018  Glucose 65 - 99 mg/dL 409(W) - 119(J)  BUN 6 - 20 mg/dL 14 - 14  Creatinine 1.610.61 - 1.24 mg/dL 0.960.66 - 0.450.68  Sodium 409135 - 145 mmol/L 138 - 141  Potassium 3.5 - 5.1 mmol/L 3.7 3.3(L) 2.8(L)  Chloride 101 - 111 mmol/L 103 - 110  CO2 22 - 32 mmol/L 26 - 24  Calcium 8.9 - 10.3 mg/dL 9.1 - 8.4(L)  Total Protein 6.0 - 8.3 g/dL - - -  Total Bilirubin 0.2 - 1.1 mg/dL - - -  Alkaline Phos 39 - 117 U/L - - -  AST 0 - 37 U/L - - -  ALT 0 - 53 U/L - - -    Lab Results  Component  Value Date   HGBA1C 12.5 (H) 04/23/2018    Assessment & Plan:   1. Type 1 diabetes mellitus on insulin therapy (HCC) Uncontrolled with A1c of 12.5, improving Increase Lantus dose due to elevated daytime sugars but morning sugars a lot better Consider splitting Lantus to twice daily dosing at next visit if still uncontrolled Counseled on Diabetic diet, my plate method, [id number] minutes of moderate intensity exercise/week Keep blood sugar logs with fasting goals of 80-120 mg/dl, random of less than [id number] and in the event of sugars less than 60 mg/dl or greater than 782400 mg/dl please notify the clinic ASAP. It is recommended that you undergo annual eye exams and annual foot exams. Pneumonia vaccine is recommended. - POCT glucose (manual entry) - Insulin Glargine (LANTUS SOLOSTAR) 100 UNIT/ML Solostar Pen; Inject 55 Units into the skin daily.  Dispense: 45 mL; Refill: 3 - glucose blood (TRUE METRIX BLOOD GLUCOSE TEST) test strip; Use 3 times daily before meals.  Dispense: 100 each; Refill: 12 - Blood Glucose Monitoring Suppl (TRUE METRIX METER) DEVI; 1 each by Does not apply route 3 (three) times daily before meals.  Dispense: 1 Device; Refill: 0 - TRUEPLUS LANCETS 28G MISC; 1 each by Does not apply route 3 (three) times daily before meals.  Dispense: 100 each; Refill: 12 - Microalbumin/Creatinine Ratio, Urine  2. Type 1 diabetes mellitus with hyperglycemia (HCC) He is 30 minutes postprandial and just took 15 units of NovoLog No additional insulin administered in the clinic   Meds ordered this encounter  Medications  . Insulin Glargine (LANTUS SOLOSTAR) 100 UNIT/ML Solostar Pen    Sig: Inject 55 Units into the skin daily.    Dispense:  45 mL    Refill:  3  . glucose blood (TRUE METRIX BLOOD GLUCOSE TEST) test strip    Sig: Use 3 times daily before meals.    Dispense:  100 each    Refill:  12  . Blood Glucose Monitoring Suppl (TRUE METRIX METER) DEVI    Sig: 1 each by Does not apply route 3  (three) times daily before meals.    Dispense:  1 Device    Refill:  0  . TRUEPLUS LANCETS 28G MISC    Sig: 1 each by Does not apply route 3 (three) times daily before meals.    Dispense:  100 each    Refill:  12    Follow-up: Return in about 3 months (around 09/30/2018) for follow up on diabetes mellitus.   Hoy RegisterEnobong Rosenda Geffrard MD

## 2018-07-01 LAB — MICROALBUMIN / CREATININE URINE RATIO
CREATININE, UR: 41.4 mg/dL
Microalb/Creat Ratio: <7.2 mg/g creat (ref 0.0–30.0)
Microalbumin, Urine: <3 ug/mL

## 2018-08-19 MED FILL — !NOVOLOG FLEXPEN SYRINGE 1: 100/ML | 26 days supply | Qty: 12 | Fill #2

## 2018-08-19 MED FILL — !LANTUS SOLOSTAR 100UNITS/M: 100 | 27 days supply | Qty: 15 | Fill #0

## 2018-10-02 ENCOUNTER — Ambulatory Visit: Payer: Medicaid Other | Admitting: Family Medicine

## 2018-10-21 ENCOUNTER — Ambulatory Visit: Payer: Self-pay | Attending: Family Medicine | Admitting: Family Medicine

## 2018-10-21 ENCOUNTER — Encounter: Payer: Self-pay | Admitting: Family Medicine

## 2018-10-21 VITALS — BP 105/62 | HR 81 | Temp 98.1°F | Ht 67.5 in | Wt 177.8 lb

## 2018-10-21 DIAGNOSIS — E669 Obesity, unspecified: Secondary | ICD-10-CM | POA: Insufficient documentation

## 2018-10-21 DIAGNOSIS — Z6827 Body mass index (BMI) 27.0-27.9, adult: Secondary | ICD-10-CM | POA: Insufficient documentation

## 2018-10-21 DIAGNOSIS — E109 Type 1 diabetes mellitus without complications: Secondary | ICD-10-CM | POA: Insufficient documentation

## 2018-10-21 DIAGNOSIS — Z23 Encounter for immunization: Secondary | ICD-10-CM | POA: Insufficient documentation

## 2018-10-21 DIAGNOSIS — Z794 Long term (current) use of insulin: Secondary | ICD-10-CM | POA: Insufficient documentation

## 2018-10-21 LAB — POCT GLYCOSYLATED HEMOGLOBIN (HGB A1C): Hemoglobin A1C: 14.2 % — AB (ref 4.0–5.6)

## 2018-10-21 LAB — GLUCOSE, POCT (MANUAL RESULT ENTRY): POC GLUCOSE: 297 mg/dL — AB (ref 70–99)

## 2018-10-21 MED ORDER — INSULIN ASPART 100 UNIT/ML FLEXPEN: PEN_INJECTOR | SUBCUTANEOUS | 3 refills | Status: DC

## 2018-10-21 MED ORDER — INSULIN GLARGINE 100 UNIT/ML SOLOSTAR PEN: 35.0000 [IU] | PEN_INJECTOR | Freq: Two times a day (BID) | SUBCUTANEOUS | 3 refills | Status: DC

## 2018-10-21 NOTE — Progress Notes (Signed)
Subjective:  Patient ID: Justin [name], male    DOB: 10/16/1996  Age: 22 y.o. MRN: [id number]  CC: Diabetes   HPI Justin [name] is a 22 year old male with type 1 diabetes mellitus (A1c 14.2) who presents today for a follow-up visit.  His fasting sugars have been in the 150-200 range and he endorses compliance with his Lantus and uses his NovoLog for mealtime coverage but has not been compliant with a diabetic diet. He denies visual concerns, numbness in extremities. He is not up-to-date on pneumonia vaccine but is unable to wait to receive it today as he has to get to work. He has no additional concerns at this time.  Past Medical History:  Diagnosis Date  . Allergy    allergic rhinitis  . Asthma    as a child, hospitalized at age 89  . Depression   . Diabetes mellitus without complication (HCC)   . Obesity     Past Surgical History:  Procedure Laterality Date  . CIRCUMCISION      No Known Allergies   Outpatient Medications Prior to Visit  Medication Sig Dispense Refill  . Blood Glucose Monitoring Suppl (TRUE METRIX METER) DEVI 1 each by Does not apply route 3 (three) times daily before meals. 1 Device 0  . glucose blood (ACCU-CHEK AVIVA) test strip Check sugars ACHS for E10.9 100 each 12  . glucose blood (TRUE METRIX BLOOD GLUCOSE TEST) test strip Use 3 times daily before meals. 100 each 12  . Insulin Syringe-Needle U-100 (TRUEPLUS INSULIN SYRINGE) 31G X 5/16" 1 ML MISC Use as directed to inject insulin 100 each 0  . Lancets (ACCU-CHEK SOFT TOUCH) lancets Check sugars ACHS for E10.9 100 each 12  . TRUEPLUS LANCETS 28G MISC 1 each by Does not apply route 3 (three) times daily before meals. 100 each 12  . insulin aspart (NOVOLOG FLEXPEN) 100 UNIT/ML FlexPen INJECT 5-15 units into skin 3 times daily. Increase by 1 unit per 10 carbs. 45 mL 3  . Insulin Glargine (LANTUS SOLOSTAR) 100 UNIT/ML Solostar Pen Inject 55 Units into the skin daily. 45 mL 3   No facility-administered  medications prior to visit.     ROS Review of Systems  Constitutional: Negative for activity change and appetite change.  HENT: Negative for sinus pressure and sore throat.   Eyes: Negative for visual disturbance.  Respiratory: Negative for cough, chest tightness and shortness of breath.   Cardiovascular: Negative for chest pain and leg swelling.  Gastrointestinal: Negative for abdominal distention, abdominal pain, constipation and diarrhea.  Endocrine: Negative.   Genitourinary: Negative for dysuria.  Musculoskeletal: Negative for joint swelling and myalgias.  Skin: Negative for rash.  Allergic/Immunologic: Negative.   Neurological: Negative for weakness, light-headedness and numbness.  Psychiatric/Behavioral: Negative for dysphoric mood and suicidal ideas.    Objective:  BP 105/62   Pulse 81   Temp 98.1 F (36.7 C) (Oral)   Ht 5' 7.5" (1.715 m)   Wt 177 lb 12.8 oz (80.6 kg)   SpO2 97%   BMI 27.44 kg/m   BP/Weight 10/21/2018 06/30/2018 05/05/2018  Systolic BP 105 129 104  Diastolic BP 62 71 68  Wt. (Lbs) 177.8 180.6 178  BMI 27.44 27.87 27.47      Physical Exam  Constitutional: He is oriented to person, place, and time. He appears well-developed and well-nourished.  Cardiovascular: Normal rate, normal heart sounds and intact distal pulses.  No murmur heard. Pulmonary/Chest: Effort normal and breath sounds normal. He has no wheezes.  He has no rales. He exhibits no tenderness.  Abdominal: Soft. Bowel sounds are normal. He exhibits no distension and no mass. There is no tenderness.  Musculoskeletal: Normal range of motion.  Neurological: He is alert and oriented to person, place, and time.  Skin: Skin is warm and dry.  Psychiatric: He has a normal mood and affect.     CMP Latest Ref Rng & Units 04/25/2018 04/24/2018 04/24/2018  Glucose 65 - 99 mg/dL 161(W) - 960(A)  BUN 6 - 20 mg/dL 14 - 14  Creatinine 5.40 - 1.24 mg/dL 9.81 - 1.91  Sodium 478 - 145 mmol/L 138 - 141    Potassium 3.5 - 5.1 mmol/L 3.7 3.3(L) 2.8(L)  Chloride 101 - 111 mmol/L 103 - 110  CO2 22 - 32 mmol/L 26 - 24  Calcium 8.9 - 10.3 mg/dL 9.1 - 8.4(L)  Total Protein 6.0 - 8.3 g/dL - - -  Total Bilirubin 0.2 - 1.1 mg/dL - - -  Alkaline Phos 39 - 117 U/L - - -  AST 0 - 37 U/L - - -  ALT 0 - 53 U/L - - -    Lipid Panel     Component Value Date/Time   CHOL 191 04/23/2018 1702   TRIG 82 04/23/2018 1702   HDL 36 (L) 04/23/2018 1702   CHOLHDL 5.3 04/23/2018 1702   VLDL 16 04/23/2018 1702   LDLCALC 139 (H) 04/23/2018 1702   LDLDIRECT 31.1 Lipemic 08/05/2012 1137    Lab Results  Component Value Date   HGBA1C 14.2 (A) 10/21/2018    Assessment & Plan:   1. Type 1 diabetes mellitus on insulin therapy (HCC) Uncontrolled with A1c of 14.2 Increased dose of Lantus and will switch to twice daily dosing.  He has been educated on down titrating Lantus by 2 units twice daily in the event of hypoglycemia.  Continue NovoLog for mealtime coverage We emphasized the need to be compliant with a diabetic diet, lifestyle modifications. Counseled on Diabetic diet, my plate method, 295 minutes of moderate intensity exercise/week Keep blood sugar logs with fasting goals of 80-120 mg/dl, random of less than 621 and in the event of sugars less than 60 mg/dl or greater than 308 mg/dl please notify the clinic ASAP. It is recommended that you undergo annual eye exams and annual foot exams. Pneumonia vaccine is recommended -he will receive this at his next visit as he is in a hurry to get to work. - POCT glucose (manual entry) - POCT glycosylated hemoglobin (Hb A1C) - Insulin Glargine (LANTUS SOLOSTAR) 100 UNIT/ML Solostar Pen; Inject 35 Units into the skin 2 (two) times daily.  Dispense: 45 mL; Refill: 3 - insulin aspart (NOVOLOG FLEXPEN) 100 UNIT/ML FlexPen; INJECT 5-15 units into skin 3 times daily. Increase by 1 unit per 10 carbs.  Dispense: 45 mL; Refill: 3  2. Need for immunization against influenza -  Flu Vaccine QUAD 36+ mos IM   Meds ordered this encounter  Medications  . Insulin Glargine (LANTUS SOLOSTAR) 100 UNIT/ML Solostar Pen    Sig: Inject 35 Units into the skin 2 (two) times daily.    Dispense:  45 mL    Refill:  3    Discontinue previous dose  . insulin aspart (NOVOLOG FLEXPEN) 100 UNIT/ML FlexPen    Sig: INJECT 5-15 units into skin 3 times daily. Increase by 1 unit per 10 carbs.    Dispense:  45 mL    Refill:  3    Follow-up: Return in about  3 months (around 01/21/2019) for Follow-up of diabetes mellitus.   Hoy Register MD

## 2018-10-24 ENCOUNTER — Other Ambulatory Visit: Payer: Self-pay | Admitting: Pharmacist

## 2018-10-24 MED ORDER — INSULIN LISPRO (1 UNIT DIAL) 100 UNIT/ML (KWIKPEN): PEN_INJECTOR | SUBCUTANEOUS | 3 refills | Status: DC

## 2018-10-24 MED FILL — $LANTUS SOLOSTAR 100 UNITS/: 100 | 30 days supply | Qty: 21 | Fill #0

## 2018-10-24 MED FILL — $HUMALOG 100 UNITS/ML KWIKP: 100 | 30 days supply | Qty: 15 | Fill #0

## 2018-11-13 MED FILL — OSELTAMIVIR PHOSPHATE 75 MG: 75 | 5 days supply | Qty: 10 | Fill #0

## 2018-11-13 MED FILL — GUAIATUSSIN AC LIQUID: 100-10 | 11 days supply | Qty: 118 | Fill #0

## 2018-11-13 MED FILL — !VENTOLIN HFA INHALER: 108 (90 BAS | 16 days supply | Qty: 18 | Fill #0

## 2018-11-13 MED FILL — AZITHROMYCIN 250 MG TABLET: 250 | 5 days supply | Qty: 6 | Fill #0

## 2018-12-09 MED FILL — $LANTUS SOLOSTAR 100 UNITS/: 100 | 30 days supply | Qty: 21 | Fill #1

## 2018-12-09 MED FILL — !HUMALOG 100 UNITS/ML KWIKP: 100 | 30 days supply | Qty: 15 | Fill #1

## 2019-01-21 ENCOUNTER — Ambulatory Visit: Payer: Medicaid Other | Admitting: Family Medicine

## 2019-02-06 MED FILL — !HUMALOG 100 UNITS/ML KWIKP: 100 | 30 days supply | Qty: 15 | Fill #2

## 2019-02-10 ENCOUNTER — Ambulatory Visit: Payer: Self-pay | Attending: Family Medicine | Admitting: Family Medicine

## 2019-02-10 ENCOUNTER — Encounter: Payer: Self-pay | Admitting: Family Medicine

## 2019-02-10 VITALS — BP 123/69 | HR 80 | Temp 98.2°F | Ht 67.5 in | Wt 170.0 lb

## 2019-02-10 DIAGNOSIS — E10649 Type 1 diabetes mellitus with hypoglycemia without coma: Secondary | ICD-10-CM

## 2019-02-10 DIAGNOSIS — L2481 Irritant contact dermatitis due to metals: Secondary | ICD-10-CM

## 2019-02-10 DIAGNOSIS — E109 Type 1 diabetes mellitus without complications: Secondary | ICD-10-CM

## 2019-02-10 DIAGNOSIS — R21 Rash and other nonspecific skin eruption: Secondary | ICD-10-CM

## 2019-02-10 LAB — POCT GLYCOSYLATED HEMOGLOBIN (HGB A1C): HbA1c, POC (controlled diabetic range): 13.8 % — AB (ref 0.0–7.0)

## 2019-02-10 LAB — GLUCOSE, POCT (MANUAL RESULT ENTRY): POC Glucose: 298 mg/dl — AB (ref 70–99)

## 2019-02-10 MED ORDER — INSULIN GLARGINE 100 UNIT/ML SOLOSTAR PEN: 40.0000 [IU] | PEN_INJECTOR | Freq: Two times a day (BID) | SUBCUTANEOUS | 3 refills | Status: DC

## 2019-02-10 MED FILL — $LANTUS SOLOSTAR 100 UNITS/: 100 | 30 days supply | Qty: 24 | Fill #0

## 2019-02-10 NOTE — Patient Instructions (Signed)
Type 1 Diabetes Mellitus, Self Care, Adult When you have type 1 diabetes (type 1 diabetes mellitus), you must make sure your blood sugar (glucose) stays in a healthy range. You can do this with:  Insulin.  Nutrition.  Exercise.  Lifestyle changes.  Other medicines, if needed.  Support from your doctors and others. How to stay aware of blood sugar   Check your blood sugar every day, as often as told.  Have your A1c (hemoglobin A1c) level checked two or more times a year. Have it checked more often if your doctor tells you to. Your doctor will set personal treatment goals for you. Generally, you should have these blood sugar levels:  Before meals (preprandial): 80-130 mg/dL (4.4-7.2 mmol/L).  After meals (postprandial): below 180 mg/dL (10 mmol/L).  A1c level: less than 7%. How to manage high and low blood sugar Signs of high blood sugar High blood sugar is called hyperglycemia. Know the signs of high blood sugar. Signs may include:  Feeling: ? Thirsty. ? Hungry. ? Very tired.  Needing to pee (urinate) more than usual.  Blurry vision. Signs of low blood sugar Low blood sugar is called hypoglycemia. This is when blood sugar is at or below 70 mg/dL (3.9 mmol/L). Signs may include:  Feeling: ? Hungry. ? Worried or nervous (anxious). ? Sweaty and clammy. ? Confused. ? Dizzy. ? Sleepy. ? Sick to your stomach (nauseous).  Having: ? A fast heartbeat. ? A headache. ? A change in your vision. ? Tingling or no feeling (numbness) around your mouth, lips, or tongue. ? Jerky movements that you cannot control (seizure).  Having trouble with: ? Moving (coordination). ? Sleeping. ? Passing out (fainting). ? Getting upset easily (irritability). Treating low blood sugar To treat low blood sugar, eat or drink something sugary right away. If you can think clearly and swallow safely, follow the 15:15 rule:  Take 15 grams of a fast-acting carb (carbohydrate). Talk with your  doctor about how much you should take.  Some fast-acting carbs are: ? Sugar tablets (glucose pills). Take 3-4 pills. ? 6-8 pieces of hard candy. ? 4-6 oz (120-50 mL) of fruit juice. ? 4-6 oz (120-150 mL) of regular (not diet) soda. ? Honey or sugar (1 Tbsp).  Check your blood sugar 15 minutes after you take the carb.  If your blood sugar is still at or below 70 mg/dL (3.9 mmol/L), take 15 grams of a carb again.  If your blood sugar does not go above 70 mg/dL (3.9 mmol/L) after 3 tries, get help right away.  After your blood sugar goes back to normal, eat a meal or a snack within 1 hour. Treating very low blood sugar If your blood sugar is at or below 54 mg/dL (3 mmol/L), you have very low blood sugar (severe hypoglycemia). This is an emergency. Do not wait to see if the symptoms will go away. Get medical help right away. Call your local emergency services (911 in the [place] If you have very low blood sugar and you cannot eat or drink, you may need a glucagon shot (injection). A family member or friend should learn how to check your blood sugar and how to give you a glucagon shot. Ask your doctor if you need to have a glucagon shot kit at home. Follow these instructions at home: Medicine  Take insulin and diabetes medicines as told.  If your doctor says you should take more or less insulin and medicines, do this exactly as told.  Do   not run out of insulin or medicines. Having diabetes can put you at risk for other long-term conditions. These include heart disease and kidney disease. Your doctor may prescribe medicines to help you not have these problems. Food   Make healthy food choices. These include: ? Chicken, fish, egg whites, and beans. ? Oats, whole wheat, bulgur, brown rice, quinoa, and millet. ? Fresh fruits and vegetables. ? Low-fat dairy products. ? Nuts, avocado, olive oil, and canola oil.  Meet with a food specialist (registered dietitian). He or she can help you make  an eating plan that is right for you.  Follow instructions from your doctor about what you cannot eat or drink.  Drink enough fluid to keep your pee (urine) pale yellow.  Keep track of carbs that you eat. Do this by reading food labels and learning food serving sizes.  Follow your sick day plan when you cannot eat or drink normally. Make this plan with your doctor so it is ready to use. Activity  Exercise 3 or more times a week.  Do not go more than 2 days without exercising.  Talk with your doctor before you start a new exercise. Your doctor may need to tell you to change: ? How much insulin or medicines you take. ? How much food you eat. Lifestyle  Do not use any tobacco products. These include cigarettes, chewing tobacco, and e-cigarettes. If you need help quitting, ask your doctor.  Ask your doctor how much alcohol is safe for you.  Learn to deal with stress. If you need help with this, ask your doctor. Body care   Stay up to date with your shots (immunizations).  Have your eyes and feet checked by a doctor as often as told.  Check your skin and feet every day. Check for cuts, bruises, redness, blisters, or sores.  Brush your teeth and gums two times a day. Floss one or more times a day.  Go to the dentist one or more times every 6 months.  Stay at a healthy weight. General instructions  Take over-the-counter and prescription medicines only as told by your doctor.  Share your diabetes care plan with: ? Your work or school. ? People you live with.  Check your pee (urine) for ketones: ? When you are sick. ? As told by your doctor.  Carry a card or wear jewelry that says you have diabetes.  Keep all follow-up visits as told by your doctor. This is important. Questions to ask your doctor  Do I need to meet with a diabetes educator?  Where can I find a support group for people with diabetes? Where to find more information To learn more about diabetes,  visit:  American Diabetes Association: www.diabetes.org  American Association of Diabetes Educators: www.diabeteseducator.org Summary  When you have type 1 diabetes, you must make sure your blood sugar (glucose) stays in a healthy range.  Check your blood sugar every day, as often as told.  Take insulin and diabetes medicines as told.  Keep all follow-up visits as told by your doctor. This is important. This information is not intended to replace advice given to you by your health care provider. Make sure you discuss any questions you have with your health care provider. Document Released: 03/12/2016 Document Revised: 05/12/2018 Document Reviewed: 12/23/2015 Elsevier Interactive Patient Education  2019 [employer].

## 2019-02-10 NOTE — Progress Notes (Signed)
Subjective:  Patient ID: Justin [name], male    DOB: August 27, 1996  Age: 23 y.o. MRN: [id number]  CC: Diabetes   HPI [name] is a 23 year old male with type 1 diabetes mellitus (A1c 13.8) who presents today for a follow-up visit. His A1c is 13.8 which is slightly decreased from 14.2 and he endorses sometimes forgetting to take his Lantus but this has been very infrequent.  Compliance with a diabetic diet has been a challenge as he is only able to eat what he can afford to buy.  Denies hypoglycemia, numbness in extremities and does not check his sugars regularly. He has no additional concerns today.  Past Medical History:  Diagnosis Date  . Allergy    allergic rhinitis  . Asthma    as a child, hospitalized at age 85  . Depression   . Diabetes mellitus without complication ([place])   . Obesity     Past Surgical History:  Procedure Laterality Date  . CIRCUMCISION      Family History  Problem Relation Age of Onset  . Cancer Neg Hx   . Diabetes Neg Hx   . Heart failure Neg Hx   . Hyperlipidemia Neg Hx   . Thyroid disease Neg Hx   . Stroke Neg Hx     No Known Allergies  Outpatient Medications Prior to Visit  Medication Sig Dispense Refill  . Blood Glucose Monitoring Suppl (TRUE METRIX METER) DEVI 1 each by Does not apply route 3 (three) times daily before meals. 1 Device 0  . glucose blood (TRUE METRIX BLOOD GLUCOSE TEST) test strip Use 3 times daily before meals. 100 each 12  . insulin lispro (HUMALOG KWIKPEN) 100 UNIT/ML KwikPen Inject 5-15 units into the skin three times daily. Increase by 1 unit per 10 carbs 45 mL 3  . Insulin Syringe-Needle U-100 (TRUEPLUS INSULIN SYRINGE) 31G X 5/16" 1 ML MISC Use as directed to inject insulin 100 each 0  . TRUEPLUS LANCETS 28G MISC 1 each by Does not apply route 3 (three) times daily before meals. 100 each 12  . Insulin Glargine (LANTUS SOLOSTAR) 100 UNIT/ML Solostar Pen Inject 35 Units into the skin 2 (two) times daily. 45 mL 3  .  glucose blood (ACCU-CHEK AVIVA) test strip Check sugars ACHS for E10.9 (Patient not taking: Reported on 02/10/2019) 100 each 12  . Lancets (ACCU-CHEK SOFT TOUCH) lancets Check sugars ACHS for E10.9 (Patient not taking: Reported on 02/10/2019) 100 each 12   No facility-administered medications prior to visit.      ROS Review of Systems  Constitutional: Negative for activity change and appetite change.  HENT: Negative for sinus pressure and sore throat.   Eyes: Negative for visual disturbance.  Respiratory: Negative for cough, chest tightness and shortness of breath.   Cardiovascular: Negative for chest pain and leg swelling.  Gastrointestinal: Negative for abdominal distention, abdominal pain, constipation and diarrhea.  Endocrine: Negative.   Genitourinary: Negative for dysuria.  Musculoskeletal: Negative for joint swelling and myalgias.  Skin: Positive for rash.  Allergic/Immunologic: Negative.   Neurological: Negative for weakness, light-headedness and numbness.  Psychiatric/Behavioral: Negative for dysphoric mood and suicidal ideas.    Objective:  BP 123/69   Pulse 80   Temp 98.2 F (36.8 C) (Oral)   Ht 5' 7.5" (1.715 m)   Wt 170 lb (77.1 kg)   SpO2 97%   BMI 26.23 kg/m   BP/Weight 02/10/2019 10/21/2018 04/10/3266  Systolic BP 124 580 998  Diastolic BP 69 62 71  Wt. (Lbs) 170 177.8 180.6  BMI 26.23 27.44 27.87      Physical Exam Constitutional:      Appearance: He is well-developed.  Cardiovascular:     Rate and Rhythm: Normal rate.     Heart sounds: Normal heart sounds. No murmur.  Pulmonary:     Effort: Pulmonary effort is normal.     Breath sounds: Normal breath sounds. No wheezing or rales.  Chest:     Chest wall: No tenderness.  Abdominal:     General: Bowel sounds are normal. There is no distension.     Palpations: Abdomen is soft. There is no mass.     Tenderness: There is no abdominal tenderness.  Musculoskeletal: Normal range of motion.  Skin:     Comments: Rash in the infraumbilical region  Neurological:     Mental Status: He is alert and oriented to person, place, and time.     CMP Latest Ref Rng & Units 04/25/2018 04/24/2018 04/24/2018  Glucose 65 - 99 mg/dL 298(H) - 182(H)  BUN 6 - 20 mg/dL 14 - 14  Creatinine 0.61 - 1.24 mg/dL 0.66 - 0.68  Sodium 135 - 145 mmol/L 138 - 141  Potassium 3.5 - 5.1 mmol/L 3.7 3.3(L) 2.8(L)  Chloride 101 - 111 mmol/L 103 - 110  CO2 22 - 32 mmol/L 26 - 24  Calcium 8.9 - 10.3 mg/dL 9.1 - 8.4(L)  Total Protein 6.0 - 8.3 g/dL - - -  Total Bilirubin 0.2 - 1.1 mg/dL - - -  Alkaline Phos 39 - 117 U/L - - -  AST 0 - 37 U/L - - -  ALT 0 - 53 U/L - - -    Lipid Panel     Component Value Date/Time   CHOL 191 04/23/2018 1702   TRIG 82 04/23/2018 1702   HDL 36 (L) 04/23/2018 1702   CHOLHDL 5.3 04/23/2018 1702   VLDL 16 04/23/2018 1702   LDLCALC 139 (H) 04/23/2018 1702   LDLDIRECT 31.1 Lipemic 08/05/2012 1137    CBC    Component Value Date/Time   WBC 3.0 (L) 04/25/2018 1459   RBC 4.59 04/25/2018 1459   HGB 12.6 (L) 04/25/2018 1459   HCT 39.1 04/25/2018 1459   PLT 250 04/25/2018 1459   MCV 85.2 04/25/2018 1459   MCH 27.5 04/25/2018 1459   MCHC 32.2 04/25/2018 1459   RDW 14.2 04/25/2018 1459   LYMPHSABS 1.1 04/25/2018 1459   MONOABS 0.3 04/25/2018 1459   EOSABS 0.0 04/25/2018 1459   BASOSABS 0.0 04/25/2018 1459    Lab Results  Component Value Date   HGBA1C 13.8 (A) 02/10/2019    Assessment & Plan:   1. Type 1 diabetes mellitus on insulin therapy ([place]) Uncontrolled with A1c of 13.8 Compliance with diabetic diet has been a major challenge due to financial constraints Increase Lantus dose Counseled on Diabetic diet, my plate method, 502 minutes of moderate intensity exercise/week Keep blood sugar logs with fasting goals of 80-120 mg/dl, random of less than 180 and in the event of sugars less than 60 mg/dl or greater than 400 mg/dl please notify the clinic ASAP. It is recommended that  you undergo annual eye exams and annual foot exams. Pneumonia vaccine is recommended. - POCT glucose (manual entry) - POCT glycosylated hemoglobin (Hb A1C) - Insulin Glargine (LANTUS SOLOSTAR) 100 UNIT/ML Solostar Pen; Inject 40 Units into the skin 2 (two) times daily.  Dispense: 45 mL; Refill: 3 - CMP14+EGFR - Lipid panel - Microalbumin/Creatinine Ratio, Urine  2. 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 Insulin Glargine (LANTUS SOLOSTAR) 100 UNIT/ML Solostar Pen    Sig: Inject 40 Units into the skin 2 (two) times daily.    Dispense:  45 mL    Refill:  3    Discontinue previous dose    Follow-up: Return in about 3 months (around 05/13/2019) for Follow-up of chronic medical conditions.       Charlott Rakes, MD, FAAFP. [facility] [place], [place]   02/10/2019, 2:36 PM

## 2019-02-11 LAB — CMP14+EGFR
ALK PHOS: 135 IU/L — AB (ref 39–117)
ALT: 13 IU/L (ref 0–44)
AST: 13 IU/L (ref 0–40)
Albumin/Globulin Ratio: 2.2 (ref 1.2–2.2)
Albumin: 4.3 g/dL (ref 4.1–5.2)
BUN/Creatinine Ratio: 24 — ABNORMAL HIGH (ref 9–20)
BUN: 18 mg/dL (ref 6–20)
Bilirubin Total: 0.4 mg/dL (ref 0.0–1.2)
CO2: 22 mmol/L (ref 20–29)
Calcium: 8.9 mg/dL (ref 8.7–10.2)
Chloride: 101 mmol/L (ref 96–106)
Creatinine, Ser: 0.75 mg/dL — ABNORMAL LOW (ref 0.76–1.27)
GFR calc Af Amer: 149 mL/min/{1.73_m2} (ref >59)
GFR calc non Af Amer: 129 mL/min/{1.73_m2} (ref >59)
GLUCOSE: 334 mg/dL — AB (ref 65–99)
Globulin, Total: 2 g/dL (ref 1.5–4.5)
Potassium: 4.1 mmol/L (ref 3.5–5.2)
Sodium: 138 mmol/L (ref 134–144)
Total Protein: 6.3 g/dL (ref 6.0–8.5)

## 2019-02-11 LAB — LIPID PANEL
Chol/HDL Ratio: 3.3 ratio (ref 0.0–5.0)
Cholesterol, Total: 156 mg/dL (ref 100–199)
HDL: 47 mg/dL (ref >39)
LDL Calculated: 97 mg/dL (ref 0–99)
TRIGLYCERIDES: 58 mg/dL (ref 0–149)
VLDL Cholesterol Cal: 12 mg/dL (ref 5–40)

## 2019-02-13 ENCOUNTER — Telehealth: Payer: Self-pay

## 2019-02-13 NOTE — Telephone Encounter (Signed)
Patient was called and informed of lab results. Patient had no questions.  

## 2019-02-13 NOTE — Telephone Encounter (Signed)
-----   Message from Hoy Register, MD sent at 02/11/2019  1:19 PM EDT ----- Cholesterol is normal however glucose is elevated.  Please advise to adhere to regimen prescribed at last office as well as a diabetic diet

## 2019-04-21 MED FILL — $LANTUS SOLOSTAR 100 UNITS/: 100 | 30 days supply | Qty: 24 | Fill #1

## 2019-04-21 MED FILL — $HUMALOG 100 UNITS/ML KWIKP: 100 | 30 days supply | Qty: 15 | Fill #3

## 2019-05-18 ENCOUNTER — Ambulatory Visit: Payer: Medicaid Other | Admitting: Family Medicine

## 2019-06-04 ENCOUNTER — Other Ambulatory Visit: Payer: Self-pay

## 2019-06-04 MED ORDER — INSULIN LISPRO (1 UNIT DIAL) 100 UNIT/ML (KWIKPEN): PEN_INJECTOR | SUBCUTANEOUS | 3 refills | Status: DC

## 2019-06-04 NOTE — Telephone Encounter (Signed)
This patient was last seen 02/10/2019 and has no future appt scheduled.Please authorize fill for PASS if appropriate.  This script will go to [facility].

## 2019-06-15 MED FILL — $LANTUS SOLOSTAR 100 UNITS/: 100 | 30 days supply | Qty: 24 | Fill #2

## 2019-06-15 MED FILL — $HUMALOG 100 UNITS/ML KWIKP: 100 | 30 days supply | Qty: 15 | Fill #4

## 2019-06-25 ENCOUNTER — Observation Stay (HOSPITAL_COMMUNITY)
Admission: RE | Admit: 2019-06-25 | Discharge: 2019-06-25 | Disposition: A | Discharge status: Home / Self Care | Payer: Federal, State, Local not specified - Other | Attending: Psychiatry | Admitting: Psychiatry

## 2019-06-25 ENCOUNTER — Other Ambulatory Visit: Payer: Self-pay

## 2019-06-25 DIAGNOSIS — E669 Obesity, unspecified: Secondary | ICD-10-CM | POA: Insufficient documentation

## 2019-06-25 DIAGNOSIS — G4700 Insomnia, unspecified: Secondary | ICD-10-CM | POA: Insufficient documentation

## 2019-06-25 DIAGNOSIS — F322 Major depressive disorder, single episode, severe without psychotic features: Principal | ICD-10-CM | POA: Diagnosis present

## 2019-06-25 DIAGNOSIS — Z560 Unemployment, unspecified: Secondary | ICD-10-CM | POA: Insufficient documentation

## 2019-06-25 DIAGNOSIS — Z79899 Other long term (current) drug therapy: Secondary | ICD-10-CM | POA: Insufficient documentation

## 2019-06-25 DIAGNOSIS — E109 Type 1 diabetes mellitus without complications: Secondary | ICD-10-CM | POA: Insufficient documentation

## 2019-06-25 DIAGNOSIS — Z6826 Body mass index (BMI) 26.0-26.9, adult: Secondary | ICD-10-CM | POA: Insufficient documentation

## 2019-06-25 DIAGNOSIS — J45909 Unspecified asthma, uncomplicated: Secondary | ICD-10-CM | POA: Insufficient documentation

## 2019-06-25 DIAGNOSIS — Z1159 Encounter for screening for other viral diseases: Secondary | ICD-10-CM | POA: Insufficient documentation

## 2019-06-25 DIAGNOSIS — Z794 Long term (current) use of insulin: Secondary | ICD-10-CM | POA: Insufficient documentation

## 2019-06-25 LAB — COMPREHENSIVE METABOLIC PANEL
ALT: 17 U/L (ref 0–44)
AST: 12 U/L — ABNORMAL LOW (ref 15–41)
Albumin: 4 g/dL (ref 3.5–5.0)
Alkaline Phosphatase: 117 U/L (ref 38–126)
Anion gap: 9 (ref 5–15)
BUN: 19 mg/dL (ref 6–20)
CO2: 27 mmol/L (ref 22–32)
Calcium: 9.2 mg/dL (ref 8.9–10.3)
Chloride: 101 mmol/L (ref 98–111)
Creatinine, Ser: 0.74 mg/dL (ref 0.61–1.24)
GFR calc Af Amer: >60 mL/min (ref >60)
GFR calc non Af Amer: >60 mL/min (ref >60)
Glucose, Bld: 315 mg/dL — ABNORMAL HIGH (ref 70–99)
Potassium: 3.9 mmol/L (ref 3.5–5.1)
Sodium: 137 mmol/L (ref 135–145)
Total Bilirubin: 0.4 mg/dL (ref 0.3–1.2)
Total Protein: 7 g/dL (ref 6.5–8.1)

## 2019-06-25 LAB — TSH: TSH: 1.99 u[IU]/mL (ref 0.350–4.500)

## 2019-06-25 LAB — RAPID URINE DRUG SCREEN, HOSP PERFORMED
Amphetamines: NOT DETECTED
Barbiturates: NOT DETECTED
Benzodiazepines: NOT DETECTED
Cocaine: NOT DETECTED
Opiates: NOT DETECTED
Tetrahydrocannabinol: NOT DETECTED

## 2019-06-25 LAB — CBC
HCT: 40.4 % (ref 39.0–52.0)
Hemoglobin: 13.4 g/dL (ref 13.0–17.0)
MCH: 27.2 pg (ref 26.0–34.0)
MCHC: 33.2 g/dL (ref 30.0–36.0)
MCV: 81.9 fL (ref 80.0–100.0)
Platelets: 316 10*3/uL (ref 150–400)
RBC: 4.93 MIL/uL (ref 4.22–5.81)
RDW: 12.8 % (ref 11.5–15.5)
WBC: 6.8 10*3/uL (ref 4.0–10.5)
nRBC: 0 % (ref 0.0–0.2)

## 2019-06-25 LAB — GLUCOSE, CAPILLARY
Glucose-Capillary: 389 mg/dL — ABNORMAL HIGH (ref 70–99)
Glucose-Capillary: 279 mg/dL — ABNORMAL HIGH (ref 70–99)
Glucose-Capillary: 215 mg/dL — ABNORMAL HIGH (ref 70–99)

## 2019-06-25 LAB — SARS CORONAVIRUS 2 BY RT PCR (HOSPITAL ORDER, PERFORMED IN ~~LOC~~ HOSPITAL LAB): SARS Coronavirus 2: NEGATIVE

## 2019-06-25 MED ORDER — HYDROXYZINE HCL 25 MG PO TABS: 25.0000 mg | ORAL_TABLET | Freq: Three times a day (TID) | ORAL | Status: DC | PRN

## 2019-06-25 MED ORDER — INSULIN GLARGINE 100 UNIT/ML ~~LOC~~ SOLN
56.0000 [IU] | Freq: Every day | SUBCUTANEOUS | Status: DC
  Administered 2019-06-25: 05:00:00 56 [IU] via SUBCUTANEOUS

## 2019-06-25 MED ORDER — MAGNESIUM HYDROXIDE 400 MG/5ML PO SUSP: 30.0000 mL | Freq: Every day | ORAL | Status: DC | PRN

## 2019-06-25 MED ORDER — ALUM & MAG HYDROXIDE-SIMETH 200-200-20 MG/5ML PO SUSP: 30.0000 mL | ORAL | Status: DC | PRN

## 2019-06-25 MED ORDER — INSULIN ASPART 100 UNIT/ML ~~LOC~~ SOLN
5.0000 [IU] | Freq: Three times a day (TID) | SUBCUTANEOUS | Status: DC
  Administered 2019-06-25: 07:00:00 11 [IU] via SUBCUTANEOUS
  Administered 2019-06-25: 13 [IU] via SUBCUTANEOUS

## 2019-06-25 MED ORDER — ACETAMINOPHEN 325 MG PO TABS: 650.0000 mg | ORAL_TABLET | Freq: Four times a day (QID) | ORAL | Status: DC | PRN

## 2019-06-25 NOTE — Progress Notes (Signed)
Inpatient Diabetes Program Recommendations  AACE/ADA: New Consensus Statement on Inpatient Glycemic Control (2015)  Target Ranges:  Prepandial:   less than 140 mg/dL      Peak postprandial:   less than 180 mg/dL (1-2 hours)      Critically ill patients:  140 - 180 mg/dL   Results for Justin [name], Justin [name] (MRN [id number]) as of 06/25/2019 08:59  Ref. Range 06/25/2019 04:13 06/25/2019 07:13  Glucose-Capillary Latest Ref Range: 70 - 99 mg/dL 389 (H)    56 units LANTUS 279 (H)  11 units NOVOLOG      Admit with: Severe major depression  History: Type 1 Diabetes  Home DM Meds: Lantus 56 units QHS       Humalog 5-15 units TID       1 unit for every 10 grams of Carbohydrates  Current Orders: Lantus 56 units QHS      Novolog 5-15 units TID with meals     PCP: Dr[facility] Margarita Rana with the [facility]--Last seen 02/10/2019     MD- Please consider the following to help better manage pt's CBGs while in hospital:  1. Stop Novolog 5-15 units TID (there are no parameters for the RN to follow with this order)   2. Start Novolog Sensitive Correction Scale/ SSI (0-9 units) TID AC + HS (Use Glycemic Control Order set)   3. Start Custom Novolog Carbohydrate Coverage Scale (Have the RN ask the Patient how many grams of Carbohydrates he ate and dose according to the below scale):  1 unit Novolog= 10 Grams Carbohydrates 2 units Novolog= 20 Grams Carbohydrates 3 units Novolog= 30 Grams Carbohydrates 4 units Novolog= 40 Grams Carbohydrates 5 units Novolog= 50 Grams Carbohydrates 6 units Novolog= 60 Grams Carbohydrates 7 units Novolog= 70 Grams Carbohydrates 8 units Novolog= 80 Grams Carbohydrates 9 units Novolog= 90 Grams Carbohydrates 10 units Novolog= 100 Grams Carbohydrates    --Will follow patient during hospitalization--  Wyn Quaker RN, MSN, CDE Diabetes Coordinator Inpatient Glycemic Control Team Team Pager: [phone] (8a-5p)

## 2019-06-25 NOTE — Discharge Instructions (Signed)
For your behavioral health needs you are advised to follow up with [facility].  New patients are seen at their walk-in clinic.  Walk-in hours are Monday - Friday from 8:30 am - 12:00 pm, and from 1:00 pm - 2:30 pm.  Walk-in patients are seen on a first come, first served basis, so try to arrive as early as possible for the best chance of being seen the same day.  There is an initial fee of $22.50: ° °     [facility] °     [address] °     [place], [place] [address] °     [phone] °
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
## 2019-06-25 NOTE — Plan of Care (Signed)
[facility] Crisis Plan  Reason for Crisis Plan:  Crisis Stabilization   Plan of Care:  Referral for Telepsychiatry/Psychiatric Consult  Family Support:    girlfriend  Current Living Environment:  Living Arrangements: Spouse/significant other  Insurance:   Hospital Account    Name Acct ID Class Status Primary Coverage   [name] [name] [id number] BEHAVIORAL HEALTH OBSERVATION Open [facility] [place]        Guarantor Account (for Hospital Account [id number])    Name Relation to Pt Service Area Active? Acct Type   [name] [name]   Address Phone       [address] [place], [place] [address] [phone])          Coverage Information (for Hospital Account [id number])    F/O Payor/Plan Precert #   [facility] [facility]/DD/SAS/[facility]-WAY [facility]    Subscriber Subscriber #   [name] [name] [name] [name] [id number]   Address Phone   [address] [place], [place] [address] [phone]      Legal Guardian:  Legal Guardian: Other:(Self)  Primary Care Provider:  Charlott [name], [name] [name]  Current Outpatient Providers:  none  Psychiatrist:  Name of Psychiatrist: None  Counselor/Therapist:  Name of Therapist: None  Compliant with Medications:  Yes  Additional Information:   [name] [name] [name] [name] K 7/23/20205:08 AM

## 2019-06-25 NOTE — H&P (Signed)
Behavioral Health Medical Screening Exam  Justin [name] is an 23 y.o. male.  Total Time spent with patient: 30 minutes   Psychiatric Specialty Exam: Physical Exam  Constitutional: He is oriented to person, place, and time. He appears well-developed and well-nourished. No distress.  HENT:  Head: Normocephalic and atraumatic.  Right Ear: External ear normal.  Left Ear: External ear normal.  Eyes: Pupils are equal, round, and reactive to light. Right eye exhibits no discharge. Left eye exhibits no discharge. No scleral icterus.  Respiratory: Effort normal. No respiratory distress.  Musculoskeletal: Normal range of motion.  Neurological: He is alert and oriented to person, place, and time.  Skin: He is not diaphoretic.  Psychiatric: His speech is normal. His mood appears anxious. He is not withdrawn and not actively hallucinating. Thought content is not paranoid and not delusional. He exhibits a depressed mood. He expresses suicidal ideation. He expresses no homicidal ideation. He expresses no suicidal plans.    Review of Systems  Constitutional: Negative for chills, fever, malaise/fatigue and weight loss.  Respiratory: Negative for cough and shortness of breath.   Cardiovascular: Negative for chest pain.  Gastrointestinal: Negative for diarrhea, nausea and vomiting.  Psychiatric/Behavioral: Positive for depression and suicidal ideas. Negative for hallucinations, memory loss and substance abuse. The patient is nervous/anxious and has insomnia.     There were no vitals taken for this visit.There is no height or weight on file to calculate BMI.  General Appearance: Casual and Fairly Groomed  Eye Contact:  Minimal  Speech:  Clear and Coherent and Normal Rate  Volume:  Decreased  Mood:  Anxious, Depressed, Hopeless and Worthless  Affect:  Congruent and Depressed  Thought Process:  Coherent, Goal Directed and Descriptions of Associations: Intact  Orientation:  Full (Time, Place, and  Person)  Thought Content:  Logical and Hallucinations: None  Suicidal Thoughts:  Yes.  without intent/plan  Homicidal Thoughts:  No  Memory:  Immediate;   Good Recent;   Good  Judgement:  Impaired  Insight:  Fair  Psychomotor Activity:  Normal  Concentration:  Concentration: Fair and Attention Span: Fair  Recall:  Good  Fund of Knowledge:  Good  Language:  Good  Akathisia:  Negative  Handed:  Right  AIMS (if indicated):     Assets:  Communication Skills Desire for Improvement Housing Intimacy Leisure Time Resilience  ADL's:  Intact  Cognition:  WNL  Sleep:          There were no vitals taken for this visit.  Recommendations:  Based on my evaluation the patient does not appear to have an emergency medical condition.  Rozetta Nunnery, NP 06/25/2019, 4:48 AM

## 2019-06-25 NOTE — Progress Notes (Signed)
[facility] Discharge note:  D:  Pt. verbalizes readiness for discharge and denies SI/HI.   A: Discharge instructions reviewed with patient, belongings returned.    R: Pt. And family verbalize understanding of d/c instructions and state their intent to be compliant with them.  Pt discharged to caregiver without incident.  Prudencio Pair, RN
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
## 2019-06-25 NOTE — BH Assessment (Signed)
[facility] Assessment Progress Note  Per Buford Dresser, DO, this pt does not require psychiatric hospitalization at this time.  Pt is o be discharged from the [facility] with recommendation to follow up with [facility] [place].  This has been included in pt's discharge instructions.  Pt's nurse has been notified.  Jalene Mullet, [place] Triage Specialist [phone]

## 2019-07-21 ENCOUNTER — Ambulatory Visit: Payer: Self-pay | Attending: Family Medicine | Admitting: Family Medicine

## 2019-07-21 ENCOUNTER — Other Ambulatory Visit: Payer: Self-pay

## 2019-08-06 MED FILL — $LANTUS SOLOSTAR 100 UNITS/: 100 | 30 days supply | Qty: 24 | Fill #3

## 2019-08-06 MED FILL — $HUMALOG 100 UNITS/ML KWIKP: 100 | 30 days supply | Qty: 15 | Fill #5

## 2019-08-14 ENCOUNTER — Emergency Department (HOSPITAL_COMMUNITY)
Admission: EM | Admit: 2019-08-14 | Discharge: 2019-08-14 | Disposition: A | Discharge status: Home / Self Care | Payer: BC Managed Care – PPO | Attending: Emergency Medicine | Admitting: Emergency Medicine

## 2019-08-14 ENCOUNTER — Emergency Department (HOSPITAL_COMMUNITY): Payer: BC Managed Care – PPO

## 2019-08-14 ENCOUNTER — Encounter (HOSPITAL_COMMUNITY): Payer: Self-pay | Admitting: Emergency Medicine

## 2019-08-14 ENCOUNTER — Other Ambulatory Visit: Payer: Self-pay

## 2019-08-14 ENCOUNTER — Ambulatory Visit (INDEPENDENT_AMBULATORY_CARE_PROVIDER_SITE_OTHER)
Admission: EM | Admit: 2019-08-14 | Discharge: 2019-08-14 | Disposition: A | Discharge status: Home / Self Care | Payer: BC Managed Care – PPO | Source: Home / Self Care

## 2019-08-14 DIAGNOSIS — Y990 Civilian activity done for income or pay: Secondary | ICD-10-CM | POA: Diagnosis not present

## 2019-08-14 DIAGNOSIS — Y9389 Activity, other specified: Secondary | ICD-10-CM | POA: Diagnosis not present

## 2019-08-14 DIAGNOSIS — W208XXA Other cause of strike by thrown, projected or falling object, initial encounter: Secondary | ICD-10-CM

## 2019-08-14 DIAGNOSIS — S060X0A Concussion without loss of consciousness, initial encounter: Secondary | ICD-10-CM | POA: Diagnosis not present

## 2019-08-14 DIAGNOSIS — Z794 Long term (current) use of insulin: Secondary | ICD-10-CM | POA: Insufficient documentation

## 2019-08-14 DIAGNOSIS — Y9289 Other specified places as the place of occurrence of the external cause: Secondary | ICD-10-CM | POA: Diagnosis not present

## 2019-08-14 DIAGNOSIS — J45909 Unspecified asthma, uncomplicated: Secondary | ICD-10-CM | POA: Insufficient documentation

## 2019-08-14 DIAGNOSIS — E109 Type 1 diabetes mellitus without complications: Secondary | ICD-10-CM | POA: Insufficient documentation

## 2019-08-14 DIAGNOSIS — S0990XA Unspecified injury of head, initial encounter: Secondary | ICD-10-CM

## 2019-08-14 DIAGNOSIS — S098XXA Other specified injuries of head, initial encounter: Secondary | ICD-10-CM | POA: Diagnosis present

## 2019-08-14 NOTE — Discharge Instructions (Signed)
Take tylenol or ibuprofen as needed for pain. Follow up with your doctor. Return here as needed.

## 2019-08-14 NOTE — ED Notes (Signed)
Patient verbalizes understanding of discharge instructions. Opportunity for questioning and answers were provided. Pt discharged from ED. 

## 2019-08-14 NOTE — ED Provider Notes (Signed)
[facility]    CSN: [id number] Arrival date & time: 08/14/19  1105      History   Chief Complaint Chief Complaint  Patient presents with  . Head Injury    HPI Justin [name] is a 23 y.o. male.   Khaleel Fingerhut presents with complaints of headache, nausea, left eye twitching, and fatigue after heavy boxes fell on the left side of his head yesterday while at work. Didn't lose consciousness. Went home and slept, states woke this morning and still with headache, 6/10. Took ibuprofen yesterday, hasn't taken any today. Didn't seem to help. No vision changes. Felt dizzy this morning but no further dizziness. No vomiting. Ambulatory without difficulty. No weakness, numbness or tingling. No neck pain. No history of bleeding or blood thinning medications. History  Of asthma, depression, dm.     ROS per HPI, negative if not otherwise mentioned.      Past Medical History:  Diagnosis Date  . Allergy    allergic rhinitis  . Asthma    as a child, hospitalized at age 75  . Depression   . Diabetes mellitus without complication (HCC)   . Obesity     Patient Active Problem List   Diagnosis Date Noted  . Severe major depression, single episode (HCC) 06/25/2019  . DKA (diabetic ketoacidoses) (HCC) 04/23/2018  . AKI (acute kidney injury) (HCC) 04/23/2018  . Type 1 diabetes mellitus, uncontrolled (HCC) 11/13/2012  . Noncompliance with diabetes treatment 11/04/2012  . Hyperglycemia 11/04/2012  . Type 1 diabetes mellitus on insulin therapy (HCC) 08/05/2012    Past Surgical History:  Procedure Laterality Date  . CIRCUMCISION         Home Medications    Prior to Admission medications   Medication Sig Start Date End Date Taking? Authorizing Provider  Insulin Glargine (LANTUS SOLOSTAR) 100 UNIT/ML Solostar Pen Inject 40 Units into the skin 2 (two) times daily. 02/10/19  Yes Newlin, Enobong, MD  insulin lispro (HUMALOG KWIKPEN) 100 UNIT/ML KwikPen Inject 5-15 units into the  skin three times daily. Increase by 1 unit per 10 carbs 06/04/19  Yes Newlin, Enobong, MD  Blood Glucose Monitoring Suppl (TRUE METRIX METER) DEVI 1 each by Does not apply route 3 (three) times daily before meals. Patient not taking: Reported on 06/25/2019 06/30/18   Hoy Register, MD  glucose blood (ACCU-CHEK AVIVA) test strip Check sugars ACHS for E10.9 Patient not taking: Reported on 02/10/2019 04/25/18   Fayrene Helper, PA-C  glucose blood (TRUE METRIX BLOOD GLUCOSE TEST) test strip Use 3 times daily before meals. Patient not taking: Reported on 06/25/2019 06/30/18   Hoy Register, MD  Insulin Syringe-Needle U-100 (TRUEPLUS INSULIN SYRINGE) 31G X 5/16" 1 ML MISC Use as directed to inject insulin Patient not taking: Reported on 06/25/2019 04/25/18   Fayrene Helper, PA-C  Lancets (ACCU-CHEK SOFT TOUCH) lancets Check sugars ACHS for E10.9 Patient not taking: Reported on 02/10/2019 04/25/18   Fayrene Helper, PA-C  TRUEPLUS LANCETS 28G MISC 1 each by Does not apply route 3 (three) times daily before meals. Patient not taking: Reported on 06/25/2019 06/30/18   Hoy Register, MD    Family History Family History  Problem Relation Age of Onset  . Cancer Neg Hx   . Diabetes Neg Hx   . Heart failure Neg Hx   . Hyperlipidemia Neg Hx   . Thyroid disease Neg Hx   . Stroke Neg Hx     Social History Social History   Tobacco Use  . Smoking  status: Never Smoker  . Smokeless tobacco: Never Used  Substance Use Topics  . Alcohol use: No  . Drug use: Yes    Types: Marijuana     Allergies   Patient has no known allergies.   Review of Systems Review of Systems   Physical Exam Triage Vital Signs ED Triage Vitals  Enc Vitals Group     BP 08/14/19 1201 116/68     Pulse Rate 08/14/19 1201 78     Resp 08/14/19 1201 14     Temp 08/14/19 1201 98.1 F (36.7 C)     Temp src --      SpO2 08/14/19 1201 99 %     Weight --      Height --      Head Circumference --      Peak Flow --      Pain Score  08/14/19 1203 6     Pain Loc --      Pain Edu? --      Excl. in GC? --    No data found.  Updated Vital Signs BP 116/68   Pulse 78   Temp 98.1 F (36.7 C)   Resp 14   SpO2 99%    Physical Exam Constitutional:      Appearance: He is well-developed.  HENT:     Head: No abrasion, masses or laceration.      Comments: Tender to left frontal head without swelling, bruising or skin breakdown  Eyes:     Extraocular Movements: Extraocular movements intact.     Conjunctiva/sclera: Conjunctivae normal.     Pupils: Pupils are equal, round, and reactive to light.  Cardiovascular:     Rate and Rhythm: Normal rate.  Pulmonary:     Effort: Pulmonary effort is normal.  Skin:    General: Skin is warm and dry.  Neurological:     General: No focal deficit present.     Mental Status: He is alert and oriented to person, place, and time.     Cranial Nerves: No cranial nerve deficit.     Sensory: No sensory deficit.     Motor: No weakness.     Coordination: Coordination normal.     Gait: Gait normal.  Psychiatric:        Mood and Affect: Mood normal.      UC Treatments / Results  Labs (all labs ordered are listed, but only abnormal results are displayed) Labs Reviewed - No data to display  EKG   Radiology No results found.  Procedures Procedures (including critical care time)  Medications Ordered in UC Medications - No data to display  Initial Impression / Assessment and Plan / UC Course  I have reviewed the triage vital signs and the nursing notes.  Pertinent labs & imaging results that were available during my care of the patient were reviewed by me and considered in my medical decision making (see chart for details).     Post traumatic headache with nausea and "eye twiching" as well as fatigue. No acute neurological findings at this time, however symptoms have not improved at all since incident. Concussive? I do recommend further more definitive evaluation in the ER  at this time to confirm this, however. Patient's girl friend able to drive patient there now. Patient verbalized understanding and agreeable to plan.  Ambulatory out of clinic without difficulty.    Final Clinical Impressions(s) / UC Diagnoses   Final diagnoses:  Injury of head, initial encounter  Discharge Instructions     With your pain level, nausea and fatigue I do recommend more thorough evaluation in the ER at this time.     ED Prescriptions    None     Controlled Substance Prescriptions [place] Controlled Substance Registry consulted? Not Applicable   Zigmund Gottron, NP 08/14/19 1324

## 2019-08-14 NOTE — ED Notes (Signed)
Patient is being discharged from the [facility] and sent to the Emergency Department via wheelchair by staff. Per Provider Augusto Gamble, patient is stable but in need of higher level of care due to Head Injury . Patient is aware and verbalizes understanding of plan of care.   Vitals:   08/14/19 1201  BP: 116/68  Pulse: 78  Resp: 14  Temp: 98.1 F (36.7 C)  SpO2: 99%

## 2019-08-14 NOTE — ED Provider Notes (Signed)
[facility] Provider Note   CSN: [id number] Arrival date & time: 08/14/19  1306     History   Chief Complaint Chief Complaint  Patient presents with  . Head Injury    HPI Domingo DimesJairo Forner is a 23 y.o. male who presents to the ED with c/o headache s/p injury. Patient reports that while working for [employer] yesterday he had several boxes fall on his head. He denies LOC but reports he has been very sleepy and has a lot of pain on the left side of his head. He also c/o nausea and feeling dizzy. Patient reports the dizziness has gotten better but he is concerned about the headache. Patient reports taking one ibuprofen after the injury yesterday but nothing since then.     HPI  Past Medical History:  Diagnosis Date  . Allergy    allergic rhinitis  . Asthma    as a child, hospitalized at age 89  . Depression   . Diabetes mellitus without complication (HCC)   . Obesity     Patient Active Problem List   Diagnosis Date Noted  . Severe major depression, single episode (HCC) 06/25/2019  . DKA (diabetic ketoacidoses) (HCC) 04/23/2018  . AKI (acute kidney injury) (HCC) 04/23/2018  . Type 1 diabetes mellitus, uncontrolled (HCC) 11/13/2012  . Noncompliance with diabetes treatment 11/04/2012  . Hyperglycemia 11/04/2012  . Type 1 diabetes mellitus on insulin therapy (HCC) 08/05/2012    Past Surgical History:  Procedure Laterality Date  . CIRCUMCISION          Home Medications    Prior to Admission medications   Medication Sig Start Date End Date Taking? Authorizing Provider  Blood Glucose Monitoring Suppl (TRUE METRIX METER) DEVI 1 each by Does not apply route 3 (three) times daily before meals. Patient not taking: Reported on 06/25/2019 06/30/18   Hoy RegisterNewlin, Enobong, MD  glucose blood (ACCU-CHEK AVIVA) test strip Check sugars ACHS for E10.9 Patient not taking: Reported on 02/10/2019 04/25/18   Fayrene Helperran, [place], PA-C  glucose blood (TRUE METRIX BLOOD  GLUCOSE TEST) test strip Use 3 times daily before meals. Patient not taking: Reported on 06/25/2019 06/30/18   Hoy RegisterNewlin, Enobong, MD  Insulin Glargine (LANTUS SOLOSTAR) 100 UNIT/ML Solostar Pen Inject 40 Units into the skin 2 (two) times daily. 02/10/19   Hoy RegisterNewlin, Enobong, MD  insulin lispro (HUMALOG KWIKPEN) 100 UNIT/ML KwikPen Inject 5-15 units into the skin three times daily. Increase by 1 unit per 10 carbs 06/04/19   Hoy RegisterNewlin, Enobong, MD  Insulin Syringe-Needle U-100 (TRUEPLUS INSULIN SYRINGE) 31G X 5/16" 1 ML MISC Use as directed to inject insulin Patient not taking: Reported on 06/25/2019 04/25/18   Fayrene Helperran, [place], PA-C  Lancets (ACCU-CHEK SOFT TOUCH) lancets Check sugars ACHS for E10.9 Patient not taking: Reported on 02/10/2019 04/25/18   Fayrene Helperran, [place], PA-C  TRUEPLUS LANCETS 28G MISC 1 each by Does not apply route 3 (three) times daily before meals. Patient not taking: Reported on 06/25/2019 06/30/18   Hoy RegisterNewlin, Enobong, MD    Family History Family History  Problem Relation Age of Onset  . Cancer Neg Hx   . Diabetes Neg Hx   . Heart failure Neg Hx   . Hyperlipidemia Neg Hx   . Thyroid disease Neg Hx   . Stroke Neg Hx     Social History Social History   Tobacco Use  . Smoking status: Never Smoker  . Smokeless tobacco: Never Used  Substance Use Topics  . Alcohol use: No  .  Drug use: Yes    Types: Marijuana     Allergies   Patient has no known allergies.   Review of Systems Review of Systems  Constitutional: Negative for chills and fever.  HENT: Negative for congestion and ear pain.   Eyes: Negative for visual disturbance.  Respiratory: Negative for cough and shortness of breath.   Cardiovascular: Negative for chest pain.  Gastrointestinal: Positive for nausea. Negative for abdominal pain and vomiting.  Skin: Negative for wound.  Neurological: Positive for dizziness and headaches.  Psychiatric/Behavioral: Negative for confusion.     Physical Exam Updated Vital Signs BP 112/69  (BP Location: Right Arm)   Pulse 63   Temp 98.5 F (36.9 C)   Resp 16   SpO2 100%   Physical Exam Constitutional:      General: He is not in acute distress.    Appearance: He is well-developed.  HENT:     Head: Normocephalic.     Right Ear: Tympanic membrane normal.     Left Ear: Tympanic membrane normal.     Nose: Nose normal.     Mouth/Throat:     Pharynx: Uvula midline.  Eyes:     Extraocular Movements: Extraocular movements intact.     Conjunctiva/sclera: Conjunctivae normal.     Pupils: Pupils are equal, round, and reactive to light.  Neck:     Musculoskeletal: Normal range of motion and neck supple.  Cardiovascular:     Rate and Rhythm: Normal rate.  Pulmonary:     Effort: Pulmonary effort is normal.  Abdominal:     Palpations: There is no mass.  Musculoskeletal:     Comments: Radial and pedal pulses strong, adequate circulation, good touch sensation.  Neurological:     Mental Status: He is alert and oriented to person, place, and time.     Cranial Nerves: No cranial nerve deficit.     Sensory: No sensory deficit.     Motor: Motor function is intact.     Coordination: Romberg sign negative.     Gait: Gait normal.     Deep Tendon Reflexes:     Reflex Scores:      Bicep reflexes are 2+ on the right side and 2+ on the left side.      Brachioradialis reflexes are 2+ on the right side and 2+ on the left side.      Patellar reflexes are 2+ on the right side and 2+ on the left side.      Achilles reflexes are 2+ on the right side and 2+ on the left side.    Comments: Stands on one foot without difficulty.  Psychiatric:        Behavior: Behavior normal.      ED Treatments / Results  Labs (all labs ordered are listed, but only abnormal results are displayed) Labs Reviewed - No data to display Radiology Ct Head Wo Contrast  Result Date: 08/14/2019 CLINICAL DATA:  23 year old male status post blunt head trauma at work yesterday. Boxes fell on head. Headache and  nausea. EXAM: CT HEAD WITHOUT CONTRAST TECHNIQUE: Contiguous axial images were obtained from the base of the skull through the vertex without intravenous contrast. COMPARISON:  Head CT 10/05/2015. FINDINGS: Brain: No midline shift, ventriculomegaly, mass effect, evidence of mass lesion, intracranial hemorrhage or evidence of cortically based acute infarction. Gray-white matter differentiation is within normal limits throughout the brain. Vascular: No suspicious intracranial vascular hyperdensity. Skull: Stable and intact. Sinuses/Orbits: Visualized paranasal sinuses and mastoids are stable  and well pneumatized. Other: Stable orbit and scalp soft tissues. No definite scalp hematoma. IMPRESSION: Stable and normal non contrast appearance of the brain. No acute traumatic injury identified. Electronically Signed   By: Genevie Ann M.D.   On: 08/14/2019 16:28    Procedures Procedures (including critical care time)  Medications Ordered in ED Medications - No data to display   Initial Impression / Assessment and Plan / ED Course  I have reviewed the triage vital signs and the nursing notes. 23 y.o. male here with headache s/p injury stable for d/c with normal CT head and improving symptoms. Discussed with the patient CT results and plan of care. Patient agrees with plan.  Pertinent imaging results that were available during my care of the patient were reviewed by me and considered in my medical decision making (see chart for details).   Final Clinical Impressions(s) / ED Diagnoses   Final diagnoses:  Concussion without loss of consciousness, initial encounter    ED Discharge Orders    None       Debroah Baller Prentiss, [place] 08/14/19 1703    Virgel Manifold, MD 08/16/19 1415

## 2019-08-14 NOTE — ED Notes (Signed)
Patient transported to CT 

## 2019-08-14 NOTE — ED Triage Notes (Addendum)
States had some boxes fall on his head yesterday at work and has been sleepy every since pain on left side of head dizzy and nauseated

## 2019-08-14 NOTE — ED Triage Notes (Signed)
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 C/o "eye twitching".

## 2019-08-14 NOTE — Discharge Instructions (Signed)
With your pain level, nausea and fatigue I do recommend more thorough evaluation in the ER at this time.

## 2019-10-05 MED FILL — $LANTUS SOLOSTAR 100 UNITS/: 100 | 30 days supply | Qty: 24 | Fill #4

## 2019-10-19 ENCOUNTER — Encounter: Payer: Self-pay | Admitting: Family Medicine

## 2019-10-19 ENCOUNTER — Other Ambulatory Visit: Payer: Self-pay

## 2019-10-19 ENCOUNTER — Ambulatory Visit: Payer: BC Managed Care – PPO | Attending: Family Medicine | Admitting: Licensed Clinical Social Worker

## 2019-10-19 ENCOUNTER — Ambulatory Visit: Payer: BC Managed Care – PPO | Attending: Family Medicine | Admitting: Family Medicine

## 2019-10-19 VITALS — BP 124/75 | HR 71 | Temp 98.3°F | Ht 67.0 in | Wt 182.0 lb

## 2019-10-19 DIAGNOSIS — Z598 Other problems related to housing and economic circumstances: Secondary | ICD-10-CM

## 2019-10-19 DIAGNOSIS — N5319 Other ejaculatory dysfunction: Secondary | ICD-10-CM | POA: Diagnosis not present

## 2019-10-19 DIAGNOSIS — Z599 Problem related to housing and economic circumstances, unspecified: Secondary | ICD-10-CM

## 2019-10-19 DIAGNOSIS — F439 Reaction to severe stress, unspecified: Secondary | ICD-10-CM

## 2019-10-19 DIAGNOSIS — E109 Type 1 diabetes mellitus without complications: Secondary | ICD-10-CM

## 2019-10-19 DIAGNOSIS — Z23 Encounter for immunization: Secondary | ICD-10-CM | POA: Diagnosis not present

## 2019-10-19 DIAGNOSIS — E10649 Type 1 diabetes mellitus with hypoglycemia without coma: Secondary | ICD-10-CM

## 2019-10-19 DIAGNOSIS — F320 Major depressive disorder, single episode, mild: Secondary | ICD-10-CM | POA: Diagnosis not present

## 2019-10-19 LAB — GLUCOSE, POCT (MANUAL RESULT ENTRY): POC Glucose: 254 mg/dl — AB (ref 70–99)

## 2019-10-19 LAB — POCT GLYCOSYLATED HEMOGLOBIN (HGB A1C): HbA1c, POC (controlled diabetic range): 14.2 % — AB (ref 0.0–7.0)

## 2019-10-19 MED ORDER — LANTUS SOLOSTAR 100 UNIT/ML ~~LOC~~ SOPN: 40.0000 [IU] | PEN_INJECTOR | Freq: Two times a day (BID) | SUBCUTANEOUS | 3 refills | Status: AC

## 2019-10-19 MED ORDER — INSULIN LISPRO (1 UNIT DIAL) 100 UNIT/ML (KWIKPEN): PEN_INJECTOR | SUBCUTANEOUS | 3 refills | Status: AC

## 2019-10-19 MED FILL — $HUMALOG 100 UNITS/ML KWIKP: 100 | 33 days supply | Qty: 15 | Fill #0

## 2019-10-19 NOTE — Patient Instructions (Signed)

## 2019-10-19 NOTE — Progress Notes (Signed)
Subjective:  Patient ID: Justin [name], male    DOB: [birth year]  Age: 23 y.o. MRN: [id number]  CC: Diabetes   HPI Justin [name] is a 23 year old male with a history of type 1 diabetes mellitus (A1c 14.2) who presents today for follow-up visit. He has not been compliant with his insulin and has not taken it on some occasions due to feeling depressed.  Denies visual concerns or neuropathy.  He attributes depression to life stressors and sometimes does not feel like taking his medications. He denies suicidal ideations at this time but 3 months ago did have some suicidal ideations.  Today he is concerned about difficulty ejaculating for the last 8 months.  He is able to attain an erection but no ejaculation.  Prior to this he never had this problem  Past Medical History:  Diagnosis Date  . Allergy    allergic rhinitis  . Asthma    as a child, hospitalized at age 67  . Depression   . Diabetes mellitus without complication (HCC)   . Obesity     Past Surgical History:  Procedure Laterality Date  . CIRCUMCISION      Family History  Problem Relation Age of Onset  . Cancer Neg Hx   . Diabetes Neg Hx   . Heart failure Neg Hx   . Hyperlipidemia Neg Hx   . Thyroid disease Neg Hx   . Stroke Neg Hx     No Known Allergies  Outpatient Medications Prior to Visit  Medication Sig Dispense Refill  . Insulin Glargine (LANTUS SOLOSTAR) 100 UNIT/ML Solostar Pen Inject 40 Units into the skin 2 (two) times daily. 45 mL 3  . insulin lispro (HUMALOG KWIKPEN) 100 UNIT/ML KwikPen Inject 5-15 units into the skin three times daily. Increase by 1 unit per 10 carbs 45 mL 3  . Blood Glucose Monitoring Suppl (TRUE METRIX METER) DEVI 1 each by Does not apply route 3 (three) times daily before meals. (Patient not taking: Reported on 06/25/2019) 1 Device 0  . glucose blood (ACCU-CHEK AVIVA) test strip Check sugars ACHS for E10.9 (Patient not taking: Reported on 02/10/2019) 100 each 12  . glucose blood (TRUE  METRIX BLOOD GLUCOSE TEST) test strip Use 3 times daily before meals. (Patient not taking: Reported on 06/25/2019) 100 each 12  . Insulin Syringe-Needle U-100 (TRUEPLUS INSULIN SYRINGE) 31G X 5/16" 1 ML MISC Use as directed to inject insulin (Patient not taking: Reported on 06/25/2019) 100 each 0  . Lancets (ACCU-CHEK SOFT TOUCH) lancets Check sugars ACHS for E10.9 (Patient not taking: Reported on 02/10/2019) 100 each 12  . TRUEPLUS LANCETS 28G MISC 1 each by Does not apply route 3 (three) times daily before meals. (Patient not taking: Reported on 06/25/2019) 100 each 12   No facility-administered medications prior to visit.      ROS Review of Systems  Constitutional: Negative for activity change and appetite change.  HENT: Negative for sinus pressure and sore throat.   Eyes: Negative for visual disturbance.  Respiratory: Negative for cough, chest tightness and shortness of breath.   Cardiovascular: Negative for chest pain and leg swelling.  Gastrointestinal: Negative for abdominal distention, abdominal pain, constipation and diarrhea.  Endocrine: Negative.   Genitourinary: Negative for dysuria.  Musculoskeletal: Negative for joint swelling and myalgias.  Skin: Negative for rash.  Allergic/Immunologic: Negative.   Neurological: Negative for weakness, light-headedness and numbness.  Psychiatric/Behavioral: Negative for dysphoric mood and suicidal ideas.    Objective:  BP 124/75   Pulse  71   Temp 98.3 F (36.8 C) (Oral)   Ht 5\' 7"  (1.702 m)   Wt 182 lb (82.6 kg)   SpO2 97%   BMI 28.51 kg/m   BP/Weight 10/19/2019 08/14/2019 05/23/3085  Systolic BP 578 469 629  Diastolic BP 75 69 68  Wt. (Lbs) 182 - -  BMI 28.51 - -  Some encounter information is confidential and restricted. Go to Review Flowsheets activity to see all data.      Physical Exam Constitutional:      Appearance: He is well-developed.  Neck:     Vascular: No JVD.  Cardiovascular:     Rate and Rhythm: Normal  rate.     Heart sounds: Normal heart sounds. No murmur.  Pulmonary:     Effort: Pulmonary effort is normal.     Breath sounds: Normal breath sounds. No wheezing or rales.  Chest:     Chest wall: No tenderness.  Abdominal:     General: Bowel sounds are normal. There is no distension.     Palpations: Abdomen is soft. There is no mass.     Tenderness: There is no abdominal tenderness.  Musculoskeletal: Normal range of motion.     Right lower leg: No edema.     Left lower leg: No edema.  Neurological:     Mental Status: He is alert and oriented to person, place, and time.  Psychiatric:        Mood and Affect: Mood normal.     CMP Latest Ref Rng & Units 06/25/2019 02/10/2019 04/25/2018  Glucose 70 - 99 mg/dL 315(H) 334(H) 298(H)  BUN 6 - 20 mg/dL 19 18 14   Creatinine 0.61 - 1.24 mg/dL 0.74 0.75(L) 0.66  Sodium 135 - 145 mmol/L 137 138 138  Potassium 3.5 - 5.1 mmol/L 3.9 4.1 3.7  Chloride 98 - 111 mmol/L 101 101 103  CO2 22 - 32 mmol/L 27 22 26   Calcium 8.9 - 10.3 mg/dL 9.2 8.9 9.1  Total Protein 6.5 - 8.1 g/dL 7.0 6.3 -  Total Bilirubin 0.3 - 1.2 mg/dL 0.4 0.4 -  Alkaline Phos 38 - 126 U/L 117 135(H) -  AST 15 - 41 U/L 12(L) 13 -  ALT 0 - 44 U/L 17 13 -    Lipid Panel     Component Value Date/Time   CHOL 156 02/10/2019 1522   TRIG 58 02/10/2019 1522   HDL 47 02/10/2019 1522   CHOLHDL 3.3 02/10/2019 1522   CHOLHDL 5.3 04/23/2018 1702   VLDL 16 04/23/2018 1702   LDLCALC 97 02/10/2019 1522   LDLDIRECT 31.1 Lipemic 08/05/2012 1137    CBC    Component Value Date/Time   WBC 6.8 06/25/2019 0704   RBC 4.93 06/25/2019 0704   HGB 13.4 06/25/2019 0704   HCT 40.4 06/25/2019 0704   PLT 316 06/25/2019 0704   MCV 81.9 06/25/2019 0704   MCH 27.2 06/25/2019 0704   MCHC 33.2 06/25/2019 0704   RDW 12.8 06/25/2019 0704   LYMPHSABS 1.1 04/25/2018 1459   MONOABS 0.3 04/25/2018 1459   EOSABS 0.0 04/25/2018 1459   BASOSABS 0.0 04/25/2018 1459    Lab Results  Component Value Date    HGBA1C 14.2 (A) 10/19/2019    Assessment & Plan:   1. Type 1 diabetes mellitus on insulin therapy ([place]) Uncontrolled with A1c of 14.2; goal is less than 7 Compliance is a major limiting factor; depression also playing a role No regimen change today but compliance has been emphasized Counseled  on Diabetic diet, my plate method, 191150 minutes of moderate intensity exercise/week Blood sugar logs with fasting goals of 80-120 mg/dl, random of less than 478180 and in the event of sugars less than 60 mg/dl or greater than 295400 mg/dl encouraged to notify the clinic. Advised on the need for annual eye exams, annual foot exams, Pneumonia vaccine. - Glucose (CBG) - HgB A1c - Basic Metabolic Panel - Insulin Glargine (LANTUS SOLOSTAR) 100 UNIT/ML Solostar Pen; Inject 40 Units into the skin 2 (two) times daily.  Dispense: 45 mL; Refill: 3 - insulin lispro (HUMALOG KWIKPEN) 100 UNIT/ML KwikPen; Inject 5-15 units into the skin three times daily. Increase by 1 unit per 10 carbs  Dispense: 45 mL; Refill: 3  2. Disorder of ejaculation - Ambulatory referral to Urology  3. Current mild episode of major depressive disorder without prior episode (HCC) Due to multiple stressors Declines initiation of medications LCSW called in for psychotherapy    Meds ordered this encounter  Medications  . Insulin Glargine (LANTUS SOLOSTAR) 100 UNIT/ML Solostar Pen    Sig: Inject 40 Units into the skin 2 (two) times daily.    Dispense:  45 mL    Refill:  3    Discontinue previous dose  . insulin lispro (HUMALOG KWIKPEN) 100 UNIT/ML KwikPen    Sig: Inject 5-15 units into the skin three times daily. Increase by 1 unit per 10 carbs    Dispense:  45 mL    Refill:  3    Follow-up: Return in about 3 months (around 01/19/2020) for medical conditions.       Hoy RegisterEnobong Rue Tinnel, MD, FAAFP. [facility], [place] [phone]   10/19/2019, 3:37 PM

## 2019-10-20 ENCOUNTER — Encounter: Payer: Self-pay | Admitting: Family Medicine

## 2019-10-20 LAB — BASIC METABOLIC PANEL
BUN/Creatinine Ratio: 29 — ABNORMAL HIGH (ref 9–20)
BUN: 20 mg/dL (ref 6–20)
CO2: 22 mmol/L (ref 20–29)
Calcium: 9.5 mg/dL (ref 8.7–10.2)
Chloride: 102 mmol/L (ref 96–106)
Creatinine, Ser: 0.7 mg/dL — ABNORMAL LOW (ref 0.76–1.27)
GFR calc Af Amer: 154 mL/min/{1.73_m2} (ref >59)
GFR calc non Af Amer: 133 mL/min/{1.73_m2} (ref >59)
Glucose: 236 mg/dL — ABNORMAL HIGH (ref 65–99)
Potassium: 4 mmol/L (ref 3.5–5.2)
Sodium: 140 mmol/L (ref 134–144)

## 2019-10-23 ENCOUNTER — Telehealth: Payer: Self-pay

## 2019-10-23 NOTE — Telephone Encounter (Signed)
Patient name and DOB has been verified Patient was informed of lab results. Patient had no questions.  

## 2019-10-23 NOTE — Telephone Encounter (Signed)
-----   Message from Charlott Rakes, MD sent at 10/20/2019 12:57 PM EST ----- Glucose is elevated otherwise other labs are stable.  Please advised to comply with diabetic regimen discussed at his last visit

## 2019-10-23 NOTE — BH Specialist Note (Signed)
Integrated Behavioral Health Initial Visit  MRN: [id number] Name: Justin [name]  Number of [place] Clinician visits:: 1/6 Session Start time: 4:00 PM  Session End time: 4:15 PM Total time: 15  Type of Service: [place] Interpretor:No. Interpretor Name and Language: NA   Warm Hand Off Completed.       SUBJECTIVE: Justin [name] is a 23 y.o. male accompanied by Friend Patient was referred by Dr. Margarita Rana for depression. Patient reports the following symptoms/concerns: Pt reports difficulty sleeping and racing thoughts triggered by psychosocial stressors. He is late on his rent resulting in possibility of eviction and has been having difficulty managing diabetes due to inability to access healthy food Duration of problem: Ongoing; Severity of problem: mild  OBJECTIVE: Mood: Anxious and Affect: Appropriate Risk of harm to self or others: No plan to harm self or others  LIFE CONTEXT: Family and Social: Pt receives support from friend, who was present during visit.  School/Work: Pt is employed Self-Care: Pt reports difficulty obtaining quality sleep and healthy food due to psychosocial stressors. To cope he will confide in his friend Life Changes: Pt has difficulty managing mental and physical health triggered by financial stressors  GOALS ADDRESSED: Patient will: 1. Reduce symptoms of: anxiety, depression and stress 2. Increase knowledge and/or ability of: coping skills  3. Demonstrate ability to: Increase healthy adjustment to current life circumstances and Increase adequate support systems for patient/family  INTERVENTIONS: Interventions utilized: Solution-Focused Strategies, Supportive Counseling and Psychoeducation and/or Health Education  Standardized Assessments completed: GAD-7 and PHQ 2&9  ASSESSMENT: Patient currently experiencing depression and anxiety symptoms triggered by psychosocial stressors. He is experiencing  financial strain that has resulted in inability to afford rent or healthy food to assist in management of medical conditions. He disclosed low motivation, energy, difficulty sleeping, racing thoughts, and worry.    Patient may benefit from linkage to community resources to assist in strengthening support system. LCSW discussed how stress can negatively impact physical and mental health, in addition, to healthy coping skills. Pt was provided information on Out of the [place] [employer]OPE program to assist with financial strain. LCSW obtained pt's permission to complete Legal Aid referral and was strongly encouraged to provide a signed copy of the CDC Declaration Form to landlord to assist in preventing eviction.  PLAN: 1. Follow up with behavioral health clinician on : Contact LCSW with any behavioral health or resource needs 2. Behavioral recommendations: Utilize coping skills and community resources provided in session 3. Referral(s): [place] (In Clinic) and [employer]:  Food, Housing and Legal Aid 4. "From scale of 1-10, how likely are you to follow plan?": [place], LCSW 10/23/2019 11:05 AM

## 2019-12-01 MED FILL — $LANTUS SOLOSTAR 100 UNITS/: 100 | 29 days supply | Qty: 24 | Fill #0

## 2019-12-01 MED FILL — $HUMALOG 100 UNITS/ML KWIKP: 100 | 33 days supply | Qty: 15 | Fill #0

## 2020-01-19 ENCOUNTER — Ambulatory Visit: Payer: BC Managed Care – PPO | Admitting: Family Medicine

## 2020-01-22 ENCOUNTER — Emergency Department (HOSPITAL_COMMUNITY)
Admission: EM | Admit: 2020-01-22 | Discharge: 2020-02-01 | Disposition: E | Payer: BC Managed Care – PPO | Attending: Emergency Medicine | Admitting: Emergency Medicine

## 2020-01-22 DIAGNOSIS — Y999 Unspecified external cause status: Secondary | ICD-10-CM | POA: Diagnosis not present

## 2020-01-22 DIAGNOSIS — Y939 Activity, unspecified: Secondary | ICD-10-CM | POA: Insufficient documentation

## 2020-01-22 DIAGNOSIS — S31639A Puncture wound without foreign body of abdominal wall, unspecified quadrant with penetration into peritoneal cavity, initial encounter: Secondary | ICD-10-CM | POA: Insufficient documentation

## 2020-01-22 DIAGNOSIS — Y929 Unspecified place or not applicable: Secondary | ICD-10-CM | POA: Insufficient documentation

## 2020-01-22 DIAGNOSIS — W3400XA Accidental discharge from unspecified firearms or gun, initial encounter: Secondary | ICD-10-CM | POA: Diagnosis not present

## 2020-01-22 DIAGNOSIS — S31139A Puncture wound of abdominal wall without foreign body, unspecified quadrant without penetration into peritoneal cavity, initial encounter: Secondary | ICD-10-CM | POA: Diagnosis present
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
## 2020-02-01 NOTE — Progress Notes (Signed)
Orthopedic Tech Progress Note Patient Details:  Justin [name] 05/28/1996 [id number] TRAUMA LEVEL 1 Patient ID: Justin [name], male   DOB: 1996/05/11, 24 y.o.   MRN: [id number]   Ancil Linsey 01/07/2020, 12:50 AM

## 2020-02-01 DEATH — deceased

## 2020-02-03 ENCOUNTER — Ambulatory Visit: Payer: BC Managed Care – PPO | Admitting: Family Medicine

## 2021-08-11 IMAGING — CT CT HEAD W/O CM
4 series · 16 of 47 positions shown, 18 images · non-contrast
Comparison: Head CT 10/05/2015.

CLINICAL DATA: 22-year-old male status post blunt head trauma at
work yesterday. Boxes fell on head. Headache and nausea.

EXAM:
CT HEAD WITHOUT CONTRAST
TECHNIQUE: Contiguous axial images were obtained from the base of the skull
through the vertex without intravenous contrast.

[Series 3: head wo · axial · 0.46mm/px · z∈[+1172,+1302]mm · 7 of 36 slices shown, 9 images]
[im 5/36  brain]
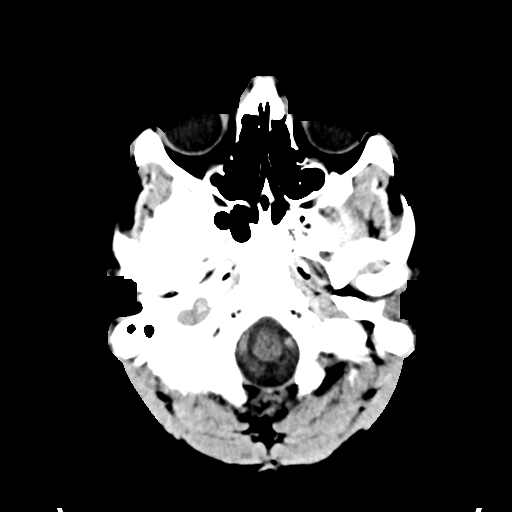
[im 5/36  bone]
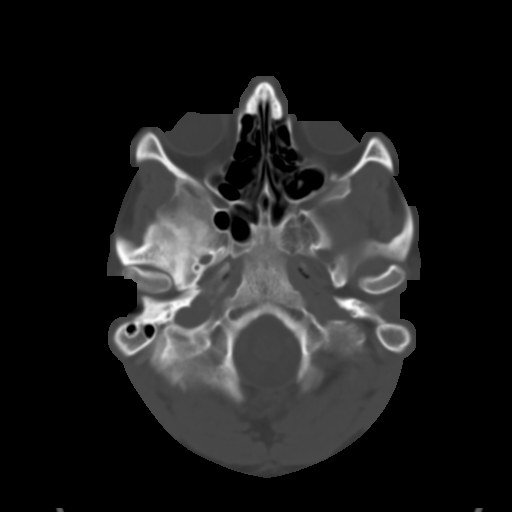
[im 9/36  brain]
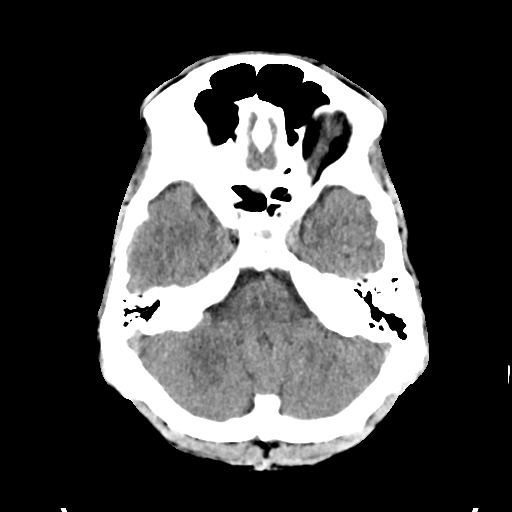
[im 14/36  brain]
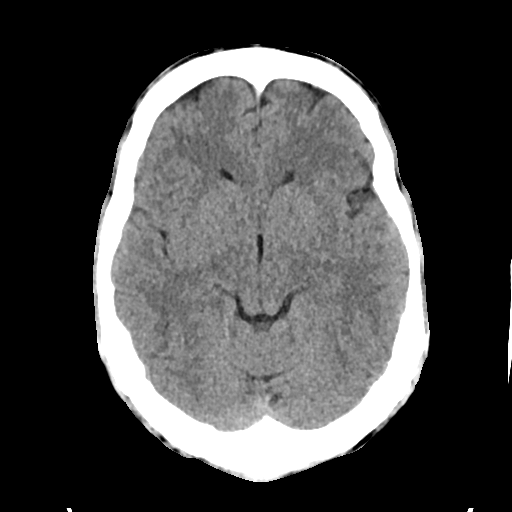
[im 18/36  brain]
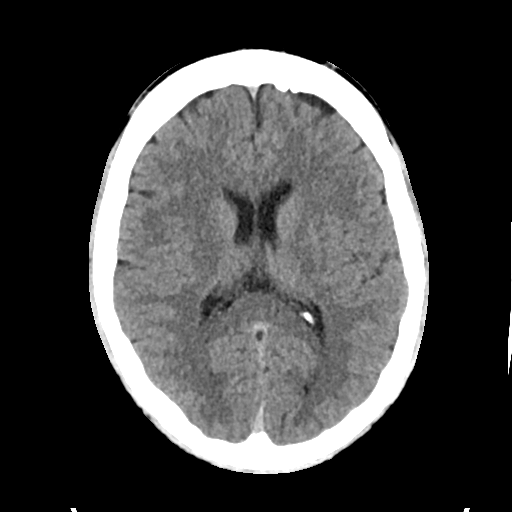
[im 22/36  brain]
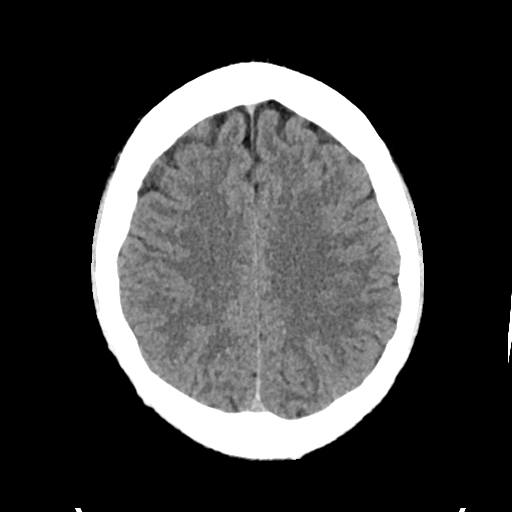
[im 22/36  bone]
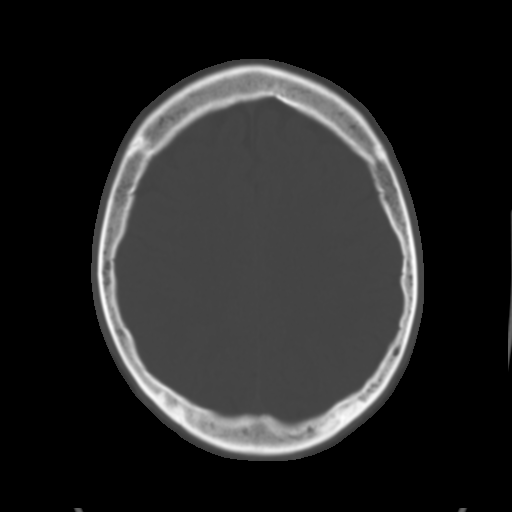
[im 27/36  brain]
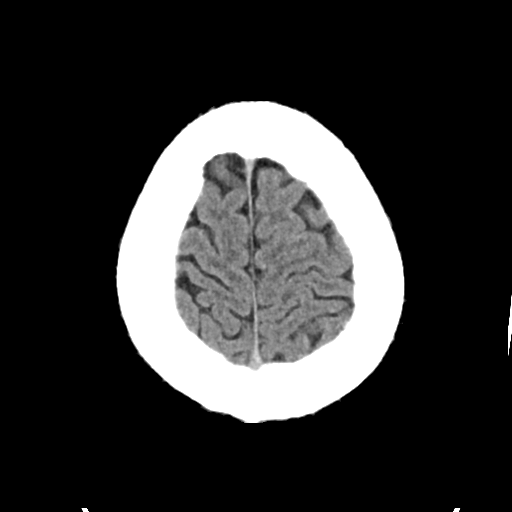
[im 31/36  brain]
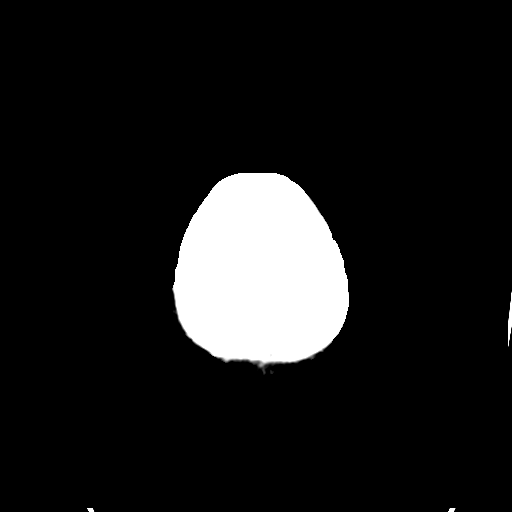

[Series 4: head bone · axial · 0.46mm/px · z∈[+1168,+1204]mm · 3 of 89 slices shown]
[im 9/89  bone]
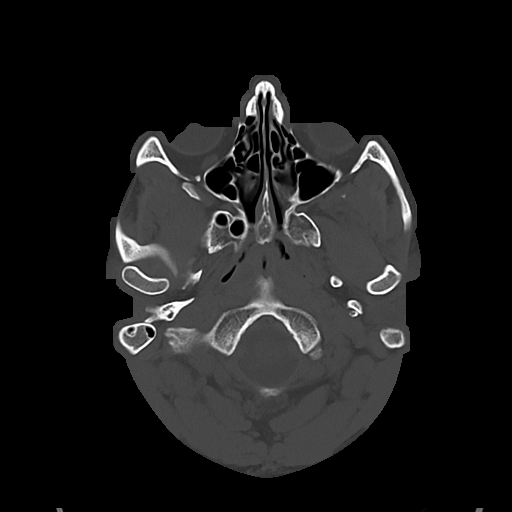
[im 18/89  bone]
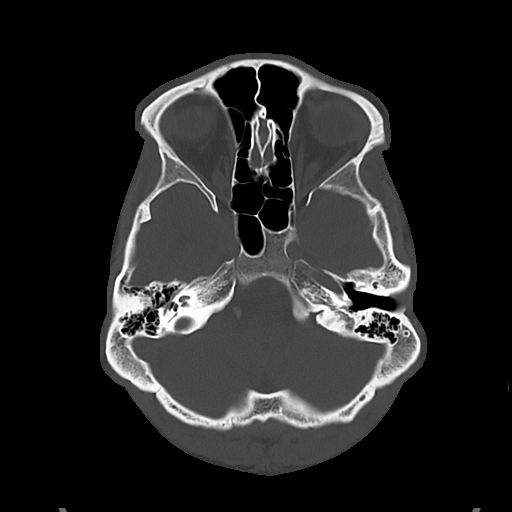
[im 27/89  bone]
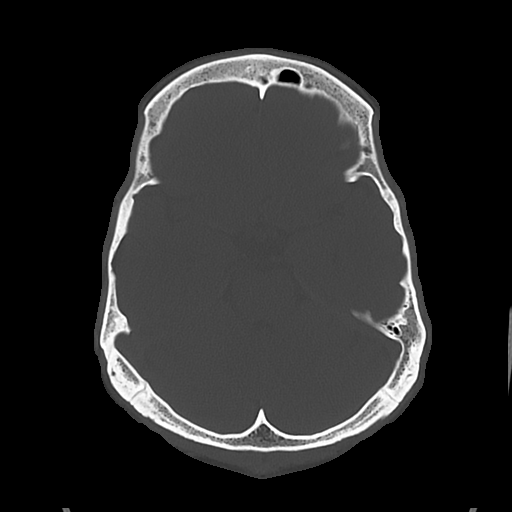

[Series 5: cor soft · coronal · 0.35mm/px · 3 of 73 slices shown]
[im 25/73  brain]
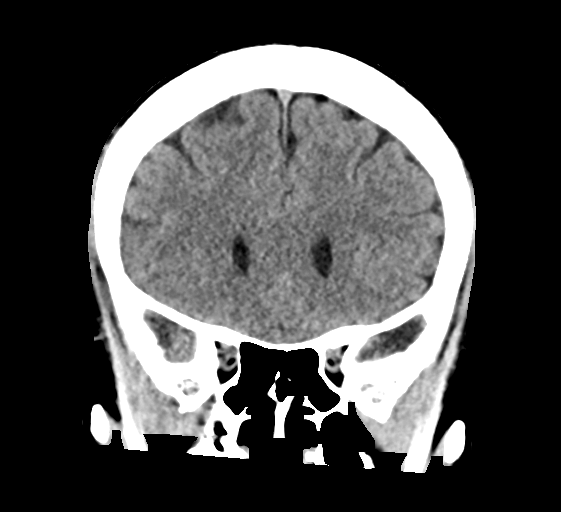
[im 33/73  brain]
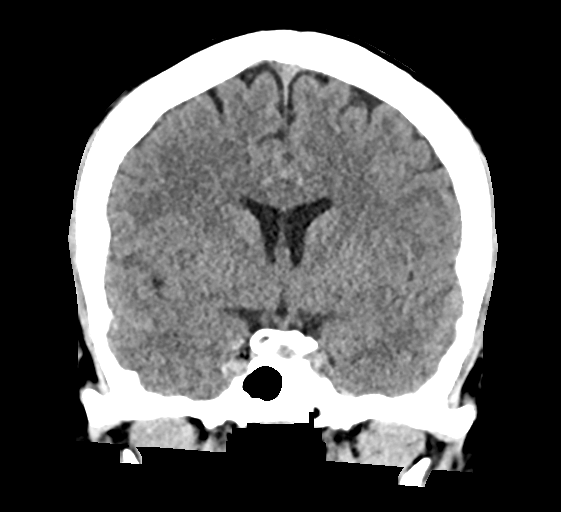
[im 41/73  brain]
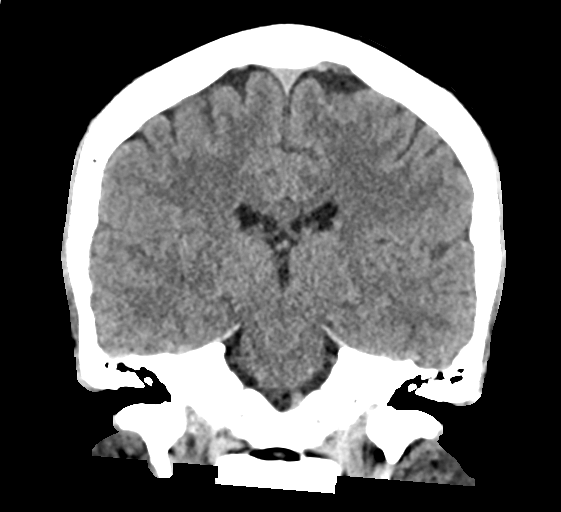

[Series 6: sag soft · sagittal · 0.35mm/px · 3 of 66 slices shown]
[im 22/66  brain]
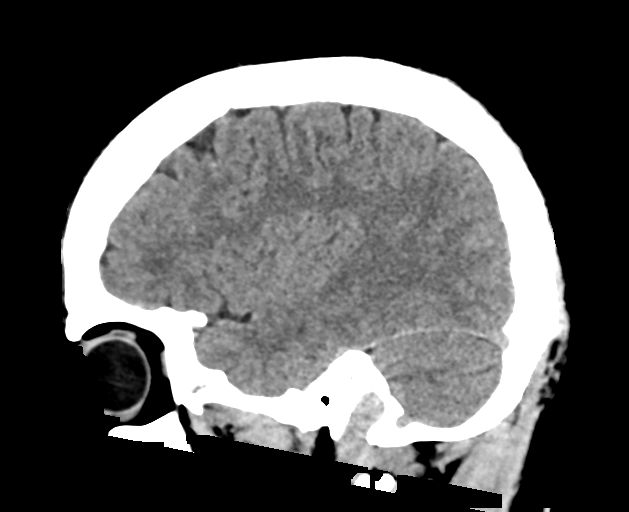
[im 33/66  brain]
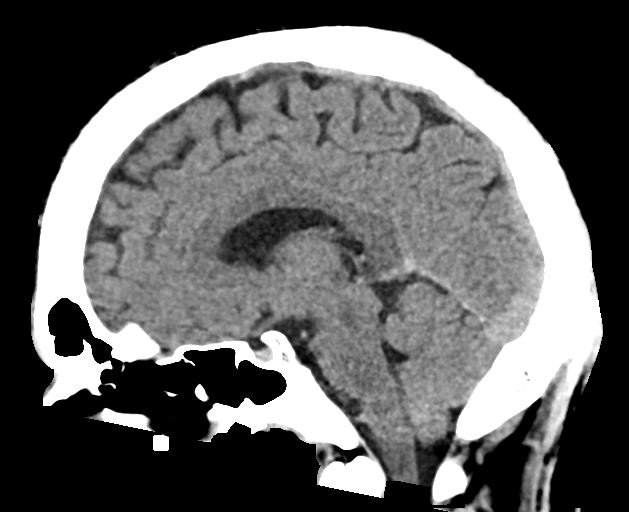
[im 44/66  brain]
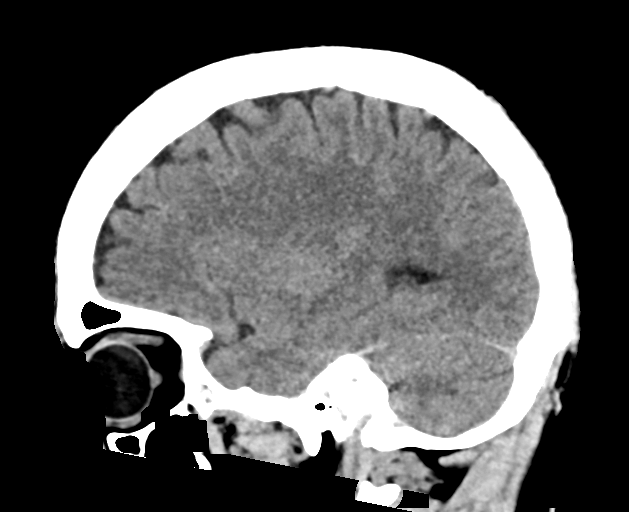

[16 of 47 positions shown; findings below may reference images not displayed]

FINDINGS: Brain: No midline shift, ventriculomegaly, mass effect, evidence of
mass lesion, intracranial hemorrhage or evidence of cortically based
acute infarction. Gray-white matter differentiation is within normal
limits throughout the brain.

Vascular: No suspicious intracranial vascular hyperdensity.

Skull: Stable and intact.

Sinuses/Orbits: Visualized paranasal sinuses and mastoids are stable
and well pneumatized.

Other: Stable orbit and scalp soft tissues. No definite scalp
hematoma.
IMPRESSION: Stable and normal non contrast appearance of the brain. No acute
traumatic injury identified.
